# Patient Record
Sex: Female | Born: 1990 | Hispanic: No | Marital: Married | Smoking: Never smoker
Health system: Southern US, Community
[De-identification: ages and names within clinical notes are randomized; demographics above are authoritative.]

## PROBLEM LIST (undated history)

## (undated) DIAGNOSIS — O24419 Gestational diabetes mellitus in pregnancy, unspecified control: Secondary | ICD-10-CM

## (undated) DIAGNOSIS — Z8742 Personal history of other diseases of the female genital tract: Secondary | ICD-10-CM

## (undated) DIAGNOSIS — M5431 Sciatica, right side: Secondary | ICD-10-CM

## (undated) DIAGNOSIS — Z789 Other specified health status: Secondary | ICD-10-CM

## (undated) HISTORY — PX: NO PAST SURGERIES: SHX2092

---

## 2017-08-05 LAB — OB RESULTS CONSOLE GC/CHLAMYDIA
Chlamydia: NEGATIVE
Gonorrhea: NEGATIVE

## 2017-08-05 LAB — OB RESULTS CONSOLE RPR: RPR: NONREACTIVE

## 2017-08-05 LAB — OB RESULTS CONSOLE HIV ANTIBODY (ROUTINE TESTING): HIV: NONREACTIVE

## 2017-08-05 LAB — OB RESULTS CONSOLE RUBELLA ANTIBODY, IGM: Rubella: IMMUNE

## 2017-08-05 LAB — OB RESULTS CONSOLE HEPATITIS B SURFACE ANTIGEN: Hepatitis B Surface Ag: NEGATIVE

## 2017-09-02 NOTE — L&D Delivery Note (Signed)
Operative Delivery Note At 7:17 PM a viable and healthy female was delivered via Vaginal, Vacuum Investment banker, operational(Extractor).  Presentation: vertex; Position: Occiput,, Anterior; Station: +3.  Verbal consent: obtained from patient.  Risks and benefits discussed in detail.  Risks include, but are not limited to the risks of anesthesia, bleeding, infection, damage to maternal tissues, fetal cephalhematoma.  There is also the risk of inability to effect vaginal delivery of the head, or shoulder dystocia that cannot be resolved by established maneuvers, leading to the need for emergency cesarean section. Indication: maternal exhaustion APGAR: 7, 9; weight pending .   Placenta status: spontaneous, intact not sent, .   Cord:  with the following complications: .  Cord pH: none  Anesthesia:epidural   Instruments: mushroom vacuum Episiotomy: Median Lacerations: None Suture Repair: 3.0 chromic Est. Blood Loss (mL):    Mom to postpartum.  Baby to Couplet care / Skin to Skin.  Haifa Hatton A Rollen Selders 02/15/2018, 7:54 PM

## 2018-02-02 LAB — OB RESULTS CONSOLE GBS: STREP GROUP B AG: NEGATIVE

## 2018-02-14 ENCOUNTER — Inpatient Hospital Stay (HOSPITAL_COMMUNITY)
Admission: AD | Admit: 2018-02-14 | Discharge: 2018-02-17 | DRG: 806 | Disposition: A | Discharge status: Home / Self Care | Payer: BLUE CROSS/BLUE SHIELD | Attending: Obstetrics and Gynecology | Admitting: Obstetrics and Gynecology

## 2018-02-14 ENCOUNTER — Encounter (HOSPITAL_COMMUNITY): Payer: Self-pay | Admitting: *Deleted

## 2018-02-14 DIAGNOSIS — O4292 Full-term premature rupture of membranes, unspecified as to length of time between rupture and onset of labor: Secondary | ICD-10-CM | POA: Diagnosis present

## 2018-02-14 DIAGNOSIS — D62 Acute posthemorrhagic anemia: Secondary | ICD-10-CM | POA: Diagnosis not present

## 2018-02-14 DIAGNOSIS — O9081 Anemia of the puerperium: Secondary | ICD-10-CM | POA: Diagnosis not present

## 2018-02-14 DIAGNOSIS — Z8759 Personal history of other complications of pregnancy, childbirth and the puerperium: Secondary | ICD-10-CM

## 2018-02-14 DIAGNOSIS — O4290 Premature rupture of membranes, unspecified as to length of time between rupture and onset of labor, unspecified weeks of gestation: Secondary | ICD-10-CM | POA: Diagnosis present

## 2018-02-14 DIAGNOSIS — Z3A37 37 weeks gestation of pregnancy: Secondary | ICD-10-CM

## 2018-02-14 HISTORY — DX: Other specified health status: Z78.9

## 2018-02-14 LAB — POCT FERN TEST: POCT Fern Test: POSITIVE

## 2018-02-14 LAB — CBC
HEMATOCRIT: 34.5 % — AB (ref 36.0–46.0)
Hemoglobin: 12.3 g/dL (ref 12.0–15.0)
MCH: 31.2 pg (ref 26.0–34.0)
MCHC: 35.7 g/dL (ref 30.0–36.0)
MCV: 87.6 fL (ref 78.0–100.0)
Platelets: 267 10*3/uL (ref 150–400)
RBC: 3.94 MIL/uL (ref 3.87–5.11)
RDW: 13 % (ref 11.5–15.5)
WBC: 11.3 10*3/uL — ABNORMAL HIGH (ref 4.0–10.5)

## 2018-02-14 LAB — TYPE AND SCREEN
ABO/RH(D): B POS
Antibody Screen: NEGATIVE

## 2018-02-14 MED ORDER — OXYCODONE-ACETAMINOPHEN 5-325 MG PO TABS: 1.0000 | ORAL_TABLET | ORAL | Status: DC | PRN

## 2018-02-14 MED ORDER — ACETAMINOPHEN 325 MG PO TABS: 650.0000 mg | ORAL_TABLET | ORAL | Status: DC | PRN

## 2018-02-14 MED ORDER — SOD CITRATE-CITRIC ACID 500-334 MG/5ML PO SOLN: 30.0000 mL | ORAL | Status: DC | PRN

## 2018-02-14 MED ORDER — TERBUTALINE SULFATE 1 MG/ML IJ SOLN: 0.2500 mg | Freq: Once | INTRAMUSCULAR | Status: DC | PRN

## 2018-02-14 MED ORDER — LACTATED RINGERS IV SOLN
INTRAVENOUS | Status: DC
  Administered 2018-02-14 – 2018-02-15 (×2): via INTRAVENOUS

## 2018-02-14 MED ORDER — OXYTOCIN 40 UNITS IN LACTATED RINGERS INFUSION - SIMPLE MED: 2.5000 [IU]/h | INTRAVENOUS | Status: DC

## 2018-02-14 MED ORDER — OXYTOCIN 40 UNITS IN LACTATED RINGERS INFUSION - SIMPLE MED
1.0000 m[IU]/min | INTRAVENOUS | Status: DC
  Administered 2018-02-15: 2 m[IU]/min via INTRAVENOUS
  Filled 2018-02-14: qty 1000

## 2018-02-14 MED ORDER — OXYTOCIN BOLUS FROM INFUSION
500.0000 mL | Freq: Once | INTRAVENOUS | Status: AC
  Administered 2018-02-15: 500 mL via INTRAVENOUS

## 2018-02-14 MED ORDER — ONDANSETRON HCL 4 MG/2ML IJ SOLN: 4.0000 mg | Freq: Four times a day (QID) | INTRAMUSCULAR | Status: DC | PRN

## 2018-02-14 MED ORDER — LIDOCAINE HCL (PF) 1 % IJ SOLN
30.0000 mL | INTRAMUSCULAR | Status: DC | PRN
  Administered 2018-02-15: 30 mL via SUBCUTANEOUS
  Filled 2018-02-14: qty 30

## 2018-02-14 MED ORDER — OXYCODONE-ACETAMINOPHEN 5-325 MG PO TABS: 2.0000 | ORAL_TABLET | ORAL | Status: DC | PRN

## 2018-02-14 MED ORDER — LACTATED RINGERS IV SOLN
500.0000 mL | INTRAVENOUS | Status: DC | PRN
  Administered 2018-02-15: 500 mL via INTRAVENOUS

## 2018-02-14 NOTE — H&P (Signed)
Laurence AlySravya Gumina is a 27 y.o. G1P0 at 7743w5d presenting for ROM. Pt notes  contractions 4/10, q 4 min . Good fetal movement, No vaginal bleeding, started leaking fluid around 1 hr prior to presentation.  [facility] 10 wks - Dated by LMP c/w 10 wk u/s - h/o PCOS, remained on metformin through preg - Femara/ IUI preg   Prenatal Transfer Tool  Maternal Diabetes: No Genetic Screening: Normal Maternal Ultrasounds/Referrals: Normal Fetal Ultrasounds or other Referrals:  None Maternal Substance Abuse:  No Significant Maternal Medications:  None Significant Maternal Lab Results: None     OB History    Gravida  1   Para      Term      Preterm      AB      Living        SAB      TAB      Ectopic      Multiple      Live Births             History reviewed. No pertinent past medical history. Family History: family history is not on file. Social History:  reports that she has never smoked. She has never used smokeless tobacco. She reports that she does not drink alcohol or use drugs.  Review of Systems - Negative except ROM   Dilation: 1 Effacement (%): 50 Station: -3 Exam by:: Camelia Enganielle Simpson RN Blood pressure 130/76, pulse 98, temperature 98.5 F (36.9 C), resp. rate 18, height 4\' 11"  (1.499 m), weight 66.2 kg (146 lb).  Physical Exam:  Vitals:   02/14/18 2207  BP: 130/76  Pulse: 98  Resp: 18  Temp: 98.5 F (36.9 C)  Weight: 66.2 kg (146 lb)  Height: 4\' 11"  (1.499 m)   Toco: q 4min FH: baseline 140s, accelerations present, no deceleratons, 10 beat variability  Prenatal labs: Antibody:  negative Rubella:  immune RPR:   NR HBsAg:   neg HIV:   neg GBS:   neg 1 hr Glucola 134  Genetic screening normal NT, nl AFP Anatomy US normal   Assessment/Plan: 27 y.o. G1P0 at 343w5d ROM, onset of contraction, will allow 4-6 hrs from ROM, if not in active labor will augment with pitocin.  GBS neg Reactive fetal testing.     Lendon ColonelKelly A  Alexx Giambra 02/14/2018, 10:45 PM

## 2018-02-14 NOTE — MAU Note (Signed)
Pt reports white, watery fluid that started leaking 30 mins ago. Pt denies bleeding. Reports contractions started after the leaking and are every 5 mins. Reports good fetal movement. States cervix was closed on last exam.

## 2018-02-14 NOTE — MAU Note (Signed)
Leaking clear fld for last . Some ctxs

## 2018-02-15 ENCOUNTER — Encounter (HOSPITAL_COMMUNITY): Payer: Self-pay

## 2018-02-15 ENCOUNTER — Inpatient Hospital Stay (HOSPITAL_COMMUNITY): Payer: BLUE CROSS/BLUE SHIELD | Admitting: Anesthesiology

## 2018-02-15 ENCOUNTER — Other Ambulatory Visit: Payer: Self-pay

## 2018-02-15 LAB — ABO/RH: ABO/RH(D): B POS

## 2018-02-15 LAB — RPR: RPR Ser Ql: NONREACTIVE

## 2018-02-15 MED ORDER — FENTANYL 2.5 MCG/ML BUPIVACAINE 1/10 % EPIDURAL INFUSION (WH - ANES)
INTRAMUSCULAR | Status: AC
  Filled 2018-02-15: qty 100

## 2018-02-15 MED ORDER — SIMETHICONE 80 MG PO CHEW: 80.0000 mg | CHEWABLE_TABLET | ORAL | Status: DC | PRN

## 2018-02-15 MED ORDER — FENTANYL 2.5 MCG/ML BUPIVACAINE 1/10 % EPIDURAL INFUSION (WH - ANES)
14.0000 mL/h | INTRAMUSCULAR | Status: DC | PRN
  Administered 2018-02-15: 10 mL/h via EPIDURAL

## 2018-02-15 MED ORDER — DIPHENHYDRAMINE HCL 25 MG PO CAPS: 25.0000 mg | ORAL_CAPSULE | Freq: Four times a day (QID) | ORAL | Status: DC | PRN

## 2018-02-15 MED ORDER — IBUPROFEN 600 MG PO TABS
600.0000 mg | ORAL_TABLET | Freq: Four times a day (QID) | ORAL | Status: DC
  Administered 2018-02-15 – 2018-02-17 (×7): 600 mg via ORAL
  Filled 2018-02-15 (×7): qty 1

## 2018-02-15 MED ORDER — PHENYLEPHRINE 40 MCG/ML (10ML) SYRINGE FOR IV PUSH (FOR BLOOD PRESSURE SUPPORT): 80.0000 ug | PREFILLED_SYRINGE | INTRAVENOUS | Status: DC | PRN

## 2018-02-15 MED ORDER — SENNOSIDES-DOCUSATE SODIUM 8.6-50 MG PO TABS
2.0000 | ORAL_TABLET | ORAL | Status: DC
  Administered 2018-02-15 – 2018-02-16 (×2): 2 via ORAL
  Filled 2018-02-15 (×2): qty 2

## 2018-02-15 MED ORDER — EPHEDRINE 5 MG/ML INJ: 10.0000 mg | INTRAVENOUS | Status: DC | PRN

## 2018-02-15 MED ORDER — ONDANSETRON HCL 4 MG PO TABS: 4.0000 mg | ORAL_TABLET | ORAL | Status: DC | PRN

## 2018-02-15 MED ORDER — OXYCODONE HCL 5 MG PO TABS
5.0000 mg | ORAL_TABLET | ORAL | Status: DC | PRN
  Administered 2018-02-16: 5 mg via ORAL
  Filled 2018-02-15: qty 1

## 2018-02-15 MED ORDER — PRENATAL MULTIVITAMIN CH
1.0000 | ORAL_TABLET | Freq: Every day | ORAL | Status: DC
  Administered 2018-02-16 – 2018-02-17 (×2): 1 via ORAL
  Filled 2018-02-15 (×2): qty 1

## 2018-02-15 MED ORDER — DIPHENHYDRAMINE HCL 50 MG/ML IJ SOLN: 12.5000 mg | INTRAMUSCULAR | Status: DC | PRN

## 2018-02-15 MED ORDER — ONDANSETRON HCL 4 MG/2ML IJ SOLN: 4.0000 mg | INTRAMUSCULAR | Status: DC | PRN

## 2018-02-15 MED ORDER — LACTATED RINGERS IV SOLN: 500.0000 mL | Freq: Once | INTRAVENOUS | Status: DC

## 2018-02-15 MED ORDER — WITCH HAZEL-GLYCERIN EX PADS: 1.0000 "application " | MEDICATED_PAD | CUTANEOUS | Status: DC | PRN

## 2018-02-15 MED ORDER — PHENYLEPHRINE 40 MCG/ML (10ML) SYRINGE FOR IV PUSH (FOR BLOOD PRESSURE SUPPORT)
PREFILLED_SYRINGE | INTRAVENOUS | Status: AC
  Administered 2018-02-15: 80 ug via INTRAVENOUS
  Filled 2018-02-15: qty 10

## 2018-02-15 MED ORDER — LIDOCAINE HCL (PF) 1 % IJ SOLN
INTRAMUSCULAR | Status: DC | PRN
  Administered 2018-02-15: 5 mL
  Administered 2018-02-15: 5 mL via EPIDURAL
  Administered 2018-02-15: 3 mL
  Administered 2018-02-15: 3 mL via EPIDURAL

## 2018-02-15 MED ORDER — LACTATED RINGERS IV SOLN
500.0000 mL | Freq: Once | INTRAVENOUS | Status: AC
  Administered 2018-02-15: 500 mL via INTRAVENOUS

## 2018-02-15 MED ORDER — OXYCODONE HCL 5 MG PO TABS: 10.0000 mg | ORAL_TABLET | ORAL | Status: DC | PRN

## 2018-02-15 MED ORDER — BUTORPHANOL TARTRATE 1 MG/ML IJ SOLN
1.0000 mg | INTRAMUSCULAR | Status: DC | PRN
Start: 2018-02-15 — End: 2018-02-15
  Administered 2018-02-15: 1 mg via INTRAVENOUS
  Filled 2018-02-15: qty 1

## 2018-02-15 MED ORDER — DIBUCAINE 1 % RE OINT
1.0000 "application " | TOPICAL_OINTMENT | RECTAL | Status: DC | PRN
  Filled 2018-02-15: qty 28

## 2018-02-15 MED ORDER — PHENYLEPHRINE 40 MCG/ML (10ML) SYRINGE FOR IV PUSH (FOR BLOOD PRESSURE SUPPORT)
80.0000 ug | PREFILLED_SYRINGE | INTRAVENOUS | Status: DC | PRN
  Administered 2018-02-15: 80 ug via INTRAVENOUS

## 2018-02-15 MED ORDER — ZOLPIDEM TARTRATE 5 MG PO TABS
5.0000 mg | ORAL_TABLET | Freq: Every evening | ORAL | Status: DC | PRN
Start: 2018-02-15 — End: 2018-02-17

## 2018-02-15 MED ORDER — COCONUT OIL OIL
1.0000 "application " | TOPICAL_OIL | Status: DC | PRN
  Filled 2018-02-15: qty 120

## 2018-02-15 MED ORDER — BENZOCAINE-MENTHOL 20-0.5 % EX AERO
1.0000 "application " | INHALATION_SPRAY | CUTANEOUS | Status: DC | PRN
  Filled 2018-02-15: qty 56

## 2018-02-15 MED ORDER — HYDROMORPHONE HCL 1 MG/ML IJ SOLN
1.0000 mg | INTRAMUSCULAR | Status: DC | PRN
  Administered 2018-02-15: 1 mg via INTRAVENOUS
  Filled 2018-02-15 (×2): qty 1

## 2018-02-15 MED ORDER — ACETAMINOPHEN 325 MG PO TABS
650.0000 mg | ORAL_TABLET | ORAL | Status: DC | PRN
Start: 2018-02-15 — End: 2018-02-17

## 2018-02-15 MED ORDER — SODIUM BICARBONATE 8.4 % IV SOLN
INTRAVENOUS | Status: DC | PRN
  Administered 2018-02-15: 5 mL via EPIDURAL

## 2018-02-15 MED ORDER — FERROUS SULFATE 325 (65 FE) MG PO TABS
325.0000 mg | ORAL_TABLET | Freq: Two times a day (BID) | ORAL | Status: DC
  Administered 2018-02-16 – 2018-02-17 (×3): 325 mg via ORAL
  Filled 2018-02-15 (×4): qty 1

## 2018-02-15 NOTE — Anesthesia Pain Management Evaluation Note (Signed)
  CRNA Pain Management Visit Note  Patient: Martha Kelley, 27 y.o., female  "Hello I am a member of the anesthesia team at [facility]. We have an anesthesia team available at all times to provide care throughout the hospital, including epidural management and anesthesia for C-section. I don't know your plan for the delivery whether it a natural birth, water birth, IV sedation, nitrous supplementation, doula or epidural, but we want to meet your pain goals."   1.Was your pain managed to your expectations on prior hospitalizations?   No prior hospitalizations  2.What is your expectation for pain management during this hospitalization?     Epidural  3.How can we help you reach that goal? Epidural  Record the patient's initial score and the patient's pain goal.   Pain: 4  Pain Goal: 7 [facility] wants you to be able to say your pain was always managed very well.  Rica RecordsICKELTON,Miosotis Wetsel 02/15/2018

## 2018-02-15 NOTE — Progress Notes (Signed)
S; tired  O;BP 110/74   Pulse (!) 113   Temp 99.4 F (37.4 C) (Oral)   Resp 16   Ht 4\' 11"  (1.499 m)   Wt 66.2 kg (146 lb)   SpO2 99%   BMI 29.49 kg/m  VE fully (+) 2 caput  Tracing: baseline 130 small accel Ctx q 2-3 mins  IMP: complete P) cont pushing

## 2018-02-15 NOTE — Anesthesia Procedure Notes (Signed)
Epidural Patient location during procedure: OB Start time: 02/15/2018 1:30 PM End time: 02/15/2018 1:46 PM  Staffing Anesthesiologist: Jairo BenJackson, Mio Schellinger, MD Performed: anesthesiologist   Preanesthetic Checklist Completed: patient identified, surgical consent, pre-op evaluation, timeout performed, IV checked, risks and benefits discussed and monitors and equipment checked  Epidural Patient position: sitting Prep: site prepped and draped and DuraPrep Patient monitoring: blood pressure, continuous pulse ox and heart rate Approach: midline Location: L3-L4 Injection technique: LOR air  Needle:  Needle type: Tuohy  Needle gauge: 17 G Needle length: 9 cm Needle insertion depth: 5 cm Catheter type: closed end flexible Catheter size: 19 Gauge Catheter at skin depth: 12 cm Test dose: negative (1% lidocaine)  Assessment Events: blood not aspirated, injection not painful, no injection resistance, negative IV test and no paresthesia  Additional Notes Pt identified in Labor room.  Monitors applied. Working IV access confirmed. Sterile prep, drape lumbar spine.  1% lido local L 3,4.  #17ga Touhy LOR air at 5 cm L 3,4, cath in easily to 10 cm skin. Test dose OK, cath dosed and infusion begun.  Patient asymptomatic, VSS, no heme aspirated, tolerated well.  Sandford Craze Emry Barbato, MDReason for block:procedure for pain

## 2018-02-15 NOTE — Progress Notes (Signed)
S: Doing well, no complaints, pain well controlled with epidural, pt feeling low pressure  O: BP (!) 133/92   Pulse 97   Temp 99.5 F (37.5 C) (Oral)   Resp 16   Ht 4\' 11"  (1.499 m)   Wt 66.2 kg (146 lb)   BMI 29.49 kg/m    FHT:  FHR: 120s bpm, variability: moderate,  accelerations:  Present,  decelerations:  Absent UC:   regular, every 2-3 minutes SVE:   Dilation: 10 Effacement (%): 100 Station: Plus 2 Exam by:: Iran OuchStephanie Russell, RN   Repeat exam by me, difficult to palpate ischial spine, +1 station though vtx visible at introitus with pushing.   A / P:  27 y.o.  OB History  Gravida Para Term Preterm AB Living  2 0 0 0 1 0  SAB TAB Ectopic Multiple Live Births  1 0 0 0 0   at 3924w6d IOL, PROM, working towards vaginal delivery. good progress in active labor. start pushing now. Will transfer care to Dr. Cherly Hensenousins at 5pm  Fetal Wellbeing:  Category I Pain Control:  Epidural  Anticipated MOD:  working towards SVD  Hartford FinancialKelly A Tinie Mcgloin 02/15/2018, 4:43 PM

## 2018-02-15 NOTE — Anesthesia Preprocedure Evaluation (Addendum)
Anesthesia Evaluation  Patient identified by MRN, date of birth, ID band Patient awake    Reviewed: Allergy & Precautions, NPO status , Patient's Chart, lab work & pertinent test results  History of Anesthesia Complications Negative for: history of anesthetic complications  Airway Mallampati: IV  TM Distance: >3 FB Neck ROM: Full    Dental  (+) Dental Advisory Given   Pulmonary neg pulmonary ROS,    breath sounds clear to auscultation       Cardiovascular negative cardio ROS   Rhythm:Regular Rate:Normal     Neuro/Psych negative neurological ROS     GI/Hepatic negative GI ROS, Neg liver ROS,   Endo/Other  negative endocrine ROS  Renal/GU negative Renal ROS     Musculoskeletal   Abdominal   Peds  Hematology plt 267k   Anesthesia Other Findings   Reproductive/Obstetrics (+) Pregnancy                            Anesthesia Physical Anesthesia Plan  ASA: II  Anesthesia Plan: Epidural   Post-op Pain Management:    Induction:   PONV Risk Score and Plan: 2 and Treatment may vary due to age or medical condition  Airway Management Planned: Natural Airway  Additional Equipment:   Intra-op Plan:   Post-operative Plan:   Informed Consent: I have reviewed the patients History and Physical, chart, labs and discussed the procedure including the risks, benefits and alternatives for the proposed anesthesia with the patient or authorized representative who has indicated his/her understanding and acceptance.   Dental advisory given  Plan Discussed with:   Anesthesia Plan Comments: (Patient identified. Risks/Benefits/Options discussed with patient including but not limited to bleeding, infection, nerve damage, paralysis, failed block, incomplete pain control, headache, blood pressure changes, nausea, vomiting, reactions to medication both or allergic, itching and postpartum back pain.  Confirmed with bedside nurse the patient's most recent platelet count. Confirmed with patient that they are not currently taking any anticoagulation, have any bleeding history or any family history of bleeding disorders. Patient expressed understanding and wished to proceed. All questions were answered. )       Anesthesia Quick Evaluation

## 2018-02-15 NOTE — Progress Notes (Signed)
S: Doing well, no complaints, pain worsening, just getting epidural now.   O: BP 124/80   Pulse 87   Temp 98.5 F (36.9 C) (Oral)   Resp 16   Ht 4\' 11"  (1.499 m)   Wt 66.2 kg (146 lb)   BMI 29.49 kg/m    FHT:  FHR: 120s bpm, variability: moderate,  accelerations:  Present,  decelerations:  Absent UC:   regular, every 2-3 minutes, pitocin SVE:   Dilation: 4 Effacement (%): 90 Station: -2 Exam by:: Dr. Ernestina PennaFogleman   Cervical foley balloon removed from vagina. AROM forebag  A / P:  27 y.o.  OB History  Gravida Para Term Preterm AB Living  2 0 0 0 1 0  SAB TAB Ectopic Multiple Live Births  1 0 0 0 0   at 293w6d IOL after SROM, on pitocin, s/p foley, getting into active labor.   Fetal Wellbeing:  Category I Pain Control:  Epidural  Anticipated MOD:  NSVD  Martha Kelley 02/15/2018, 11:23 AM

## 2018-02-15 NOTE — Anesthesia Procedure Notes (Signed)
Epidural Patient location during procedure: OB Start time: 02/15/2018 10:45 AM End time: 02/15/2018 11:14 AM  Staffing Anesthesiologist: Jairo BenJackson, Suni Jarnagin, MD Performed: anesthesiologist   Preanesthetic Checklist Completed: patient identified, surgical consent, pre-op evaluation, timeout performed, IV checked, risks and benefits discussed and monitors and equipment checked  Epidural Patient position: sitting Prep: site prepped and draped and DuraPrep Patient monitoring: blood pressure, continuous pulse ox and heart rate Approach: midline Location: L3-L4 Injection technique: LOR air  Needle:  Needle type: Tuohy  Needle gauge: 17 G Needle length: 9 cm Needle insertion depth: 6 cm Catheter type: closed end flexible Catheter size: 19 Gauge Catheter at skin depth: 11 cm Test dose: negative (1% lidocaine)  Assessment Events: blood not aspirated, injection not painful, no injection resistance, negative IV test and no paresthesia  Additional Notes Pt identified in Labor room.  Monitors applied. Working IV access confirmed. Sterile prep, drape lumbar spine.  1% lido local L 2,3.  #17ga Touhy os, repeat local L 3,4 and #17 ga Touhy LOR air at 6 cm, cath difficult to thread, dose through Touhy and cath in easily to 11 cm skin. Test dose OK, cath dosed and infusion begun.  Patient asymptomatic, VSS, no heme aspirated, tolerated well.  Martha Kelley, MDReason for block:procedure for pain

## 2018-02-15 NOTE — Progress Notes (Signed)
S: Doing well, no complaints, pain well controlled, feeling 'little pain' that 'comes and goes'. Single dose IV pain med at 2am. Planning epidural  O: BP 116/76   Pulse 77   Temp 98.5 F (36.9 C) (Oral)   Resp 16   Ht 4\' 11"  (1.499 m)   Wt 66.2 kg (146 lb)   BMI 29.49 kg/m    FHT:  FHR: 120s bpm, variability: moderate,  accelerations:  Present,  decelerations:  Absent UC:   regular, every 34 minutes, pit at 14 muits/ min SVE:   Dilation: 2 Effacement (%): 90 Station: -2 Exam by:: Dr Ernestina PennaFogleman   Foley balloon placed: exam done, consent obtained, foley catheter threaded through cervix, filled with 60cc saline, placed on traction. Pt tolerated well.    A / P:  27 y.o.  OB History  Gravida Para Term Preterm AB Living  1 0 0 0 0 0  SAB TAB Ectopic Multiple Live Births  0 0 0 0 0   at 3246w6d SROM, now on pitocin and still in latent labor, cervical foley now.  Fetal Wellbeing:  Category I Pain Control:  IV pain meds  Anticipated MOD:  NSVD  Lendon ColonelKelly A Icesis Renn 02/15/2018, 8:28 AM

## 2018-02-16 DIAGNOSIS — Z8759 Personal history of other complications of pregnancy, childbirth and the puerperium: Secondary | ICD-10-CM

## 2018-02-16 LAB — CBC
HEMATOCRIT: 25.6 % — AB (ref 36.0–46.0)
Hemoglobin: 9.2 g/dL — ABNORMAL LOW (ref 12.0–15.0)
MCH: 31.4 pg (ref 26.0–34.0)
MCHC: 35.9 g/dL (ref 30.0–36.0)
MCV: 87.4 fL (ref 78.0–100.0)
Platelets: 197 10*3/uL (ref 150–400)
RBC: 2.93 MIL/uL — ABNORMAL LOW (ref 3.87–5.11)
RDW: 13.2 % (ref 11.5–15.5)
WBC: 14.8 10*3/uL — ABNORMAL HIGH (ref 4.0–10.5)

## 2018-02-16 MED ORDER — MAGNESIUM OXIDE 400 (241.3 MG) MG PO TABS
400.0000 mg | ORAL_TABLET | Freq: Every day | ORAL | Status: DC
  Administered 2018-02-16 – 2018-02-17 (×2): 400 mg via ORAL
  Filled 2018-02-16 (×3): qty 1

## 2018-02-16 NOTE — Progress Notes (Signed)
PPD #1, VAVD, median episiotomy repair, baby boy   S:  Reports feeling tired, but okay             Tolerating po/ No nausea or vomiting / Denies dizziness or SOB             Bleeding is moderate, denies clots              Pain controlled with Motrin             Up ad lib / ambulatory / voiding QS  Newborn breast feeding with formula supplementation - states breastfeeding is very difficult; has tried latching and pumping; then formula fed; open to continuing to try breastfeeding  / Circumcision - declined  O:               VS: BP 103/77 (BP Location: Right Arm)   Pulse 90   Temp 98.3 F (36.8 C) (Oral)   Resp 18   Ht 4\' 11"  (1.499 m)   Wt 66.2 kg (146 lb)   SpO2 100%   Breastfeeding? Unknown   BMI 29.49 kg/m    LABS:              Recent Labs    02/14/18 2240 02/16/18 0500  WBC 11.3* 14.8*  HGB 12.3 9.2*  PLT 267 197               Blood type: --/--/B POS, B POS Performed at [facility], [address] [place], [place] [address]  [phone](06/15 2240)  Rubella: Immune (12/04 0000)                     I&O: Intake/Output      06/16 0701 - 06/17 0700 06/17 0701 - 06/18 0700   Urine (mL/kg/hr) 1325 (0.8)    Blood 350    Total Output 1675    Net -1675         Urine Occurrence 1 x                  Physical Exam:             Alert and oriented X3  Lungs: Clear and unlabored  Heart: regular rate and rhythm / no murmurs  Abdomen: soft, non-tender, non-distended              Fundus: firm, non-tender, U-1  Perineum: well approximated median episiotomy repair - mild edema noted; ice pack in place; no significant erythema, ecchymosis or evidence of hematoma   Lochia: moderate, no clots   Extremities: no edema, no calf pain or tenderness, TED hose in place    A: PPD # 1, VAVD  Median episiotomy   ABL Anemia - on oral FE BID  Doing well - stable status  P: Routine post partum orders  Magnesium oxide 400mg  daily   See lactation today   May shower today   Encouraged to  rest when baby rests  Anticipate discharge home tomorrow   Carlean JewsMeredith Millard Bautch, MSN, CNM [facility]

## 2018-02-16 NOTE — Anesthesia Postprocedure Evaluation (Signed)
Anesthesia Post Note  Patient: Martha Kelley  Procedure(s) Performed: AN AD HOC LABOR EPIDURAL     Patient location during evaluation: Mother Baby Anesthesia Type: Epidural Level of consciousness: awake Pain management: satisfactory to patient Vital Signs Assessment: post-procedure vital signs reviewed and stable Respiratory status: spontaneous breathing Cardiovascular status: stable Anesthetic complications: no    Last Vitals:  Vitals:   02/16/18 0245 02/16/18 0638  BP: 110/73 103/77  Pulse: 89 90  Resp: 18 18  Temp: 36.7 C 36.8 C  SpO2: 100% 100%    Last Pain:  Vitals:   02/16/18 0638  TempSrc: Oral  PainSc: 0-No pain   Pain Goal:                 Cephus ShellingBURGER,Emory Gallentine

## 2018-02-16 NOTE — Lactation Note (Signed)
This note was copied from a baby's chart. Lactation Consultation Note Baby 8 hrs old. 37 wks. 6.1lbs parents understand baby will drop below 6 lbs in weight. Stressed importance of I&O. Gagging occasionally. FOB gave formula. Early term infant information reviewed w/supplementing amount.  Parents refused need of interpreter. Encouraged at any time didn't understand anything to ask for interpreter. Parents agreed and stated they understand English well.  Mom has heavy round breast w/bulbous areola semi flat nipple. Everts slightly w/stimulation. Encouraged hand pump prior to latching and roll nipple in finger tips to evert.  Shells given to wear in Kelley. Mom has nursing bra. LC fears baby may not obtain deep enough latch if breast filling and not compressible.  LC hand expressed 3ml colostrum. Milk storage discussed.  Mom shown how to use DEBP & how to disassemble, clean, & reassemble parts. Mom knows to pump q3h for 15-20 min. Encouraged to give any colostrum to baby as supplement d/t weight will be under 6 lbs.  Newborn behavior, STS, I&O, cluster feeding, supply and demand discussed.  Mom encouraged to feed baby 8-12 times/24 hours and with feeding cues.  Encouraged to call for assistance or questions.  WH/LC brochure given w/resources, support groups and LC services.  Patient Name: Martha Kelley ZOXWR'UToday's Date: Kelley Reason for consult: Initial assessment;Early term 37-38.6wks;Maternal endocrine disorder Type of Endocrine Disorder?: Diabetes(PCOS)   Maternal Data    Feeding Feeding Type: Formula Nipple Type: Slow - flow  LATCH Score       Type of Nipple: Flat(semi flat)  Comfort (Breast/Nipple): Soft / non-tender        Interventions Interventions: Breast feeding basics reviewed;Support pillows;Expressed milk;Breast massage;Hand express;Shells;Reverse pressure;Hand pump;Pre-pump if needed;Breast compression;DEBP  Lactation Tools Discussed/Used Tools:  Shells;Pump Shell Type: Inverted Breast pump type: Double-Electric Breast Pump;Manual Pump Review: Setup, frequency, and cleaning;Milk Storage Initiated by:: Peri JeffersonL. Quintyn Dombek RN IBCLC Date initiated:: 02/16/18   Consult Status Consult Status: Follow-up Date: 02/16/18 Follow-up type: In-patient    Martha Kelley, Martha NickelLAURA G Kelley, Martha Kelley

## 2018-02-17 MED ORDER — IBUPROFEN 600 MG PO TABS: 600.0000 mg | ORAL_TABLET | Freq: Four times a day (QID) | ORAL | 0 refills | Status: DC

## 2018-02-17 MED ORDER — FERROUS SULFATE 325 (65 FE) MG PO TABS: 325.0000 mg | ORAL_TABLET | Freq: Every day | ORAL | 3 refills | Status: DC

## 2018-02-17 NOTE — Lactation Note (Addendum)
This note was copied from a baby's chart. Lactation Consultation Note  Patient Name: Boy Laurence AlySravya Maietta XBJYN'WToday's Date: 02/17/2018   Mom was observed pumping. She is comfortable with size 24 flanges. She's not getting much with pumping, but her breasts are heavier. Hand expression was taught to Mom. Mom is still learning how to do hand expression herself, but lots of colostrum was yielded when I was showing her how to do it. We did hand expression before and after pumping. Dad will finger-feed the EBM (a few mLs) at the next feeding (he has a curved-tip syringe and knows how to do it).    Mom would like to feed infant at breast. I told her we would address this once the bili levels have decreased (it is likely she will need a nipple shield to get infant to latch). Meanwhile, I encouraged her to pump/hand express every time infant receives formula. She verbalized understanding.  Mom has an Ameda pump at home. Mom w/hx of PCOS.   Lurline HareRichey, Kasiya Burck [facility] 02/17/2018, 2:40 PM

## 2018-02-17 NOTE — Discharge Summary (Signed)
Obstetric Discharge Summary   Patient Name: Martha Kelley DOB: 1991-01-22 MRN: [id number]  Date of Admission: 02/14/2018 Date of Discharge: 02/17/2018 Date of Delivery: 02/15/18 Gestational Age at Delivery: [redacted]w[redacted]d  Primary OB: [facility] - Dr. Juliene Pina  Antepartum complications:  Dated by LMP c/w 10 wk u/s - h/o PCOS, remained on metformin through preg - Femara/ IUI preg Prenatal Labs:  ABO/RH: B Pos Antibody:  negative Rubella:  immune RPR:   NR HBsAg:   neg HIV:   neg GBS:   neg 1 hr Glucola 134          Genetic screening normal NT, nl AFP Anatomy US normal  Admitting Diagnosis: SROM at 37+6 weeks   Secondary Diagnoses: Patient Active Problem List   Diagnosis Date Noted  . Status post vacuum-assisted vaginal delivery: maternal exhaustion 02/16/2018  . Obstetrical laceration: median episiotomy  02/16/2018  . Postpartum care following VAVD (6/16) 02/15/2018  . PROM (premature rupture of membranes) 02/14/2018    Augmentation: Pitocin  Complications: None  Date of Delivery: 01/15/18 Delivered By: Dr. Cherly Hensen Delivery Type: VAVD Anesthesia: epidural Placenta: sponatneous Laceration: none Episiotomy: Median  Newborn Data: Live born female  Birth Weight: 6 lb 1.5 oz (2765 g) APGAR: 7, 9  Newborn Delivery   Birth date/time:  02/15/2018 19:17:00 Delivery type:  Vaginal, Vacuum (Extractor)     Hospital/Postpartum Course  (Vaginal Delivery): Pt. Admitted in labor with SROM, progressed well with SROM and Pitocin.  She was delivered by VAVD due to maternal exhaustion - see notes for details.  Patient had an uncomplicated postpartum course.  By time of discharge on PPD#2, her pain was controlled on oral pain medications; she had appropriate lochia and was ambulating, voiding without difficulty and tolerating regular diet.  She was deemed stable for discharge to home.    Labs: CBC Latest Ref Rng & Units 02/16/2018 02/14/2018  WBC 4.0 - 10.5 K/uL 14.8(H) 11.3(H)   Hemoglobin 12.0 - 15.0 g/dL 4.0(J) 81.1  Hematocrit 36.0 - 46.0 % 25.6(L) 34.5(L)  Platelets 150 - 400 K/uL 197 267   Conflict (See Lab Report): B POS/B POS Performed at [facility], [address], [place], [place] [address]   Physical exam:  BP 117/76 (BP Location: Right Arm)   Pulse 82   Temp 98.9 F (37.2 C) (Oral)   Resp 18   Ht 4\' 11"  (1.499 m)   Wt 66.2 kg (146 lb)   SpO2 98%   Breastfeeding? Unknown   BMI 29.49 kg/m  General: alert and no distress Pulm: normal respiratory effort Lochia: appropriate Abdomen: soft, NT Uterine Fundus: firm, below umbilicus Perineum: healing well, no significant erythema, no significant edema Extremities: No evidence of DVT seen on physical exam. Trace pedal edema.  Disposition: stable, discharge to home Baby Feeding: breast milk and formula Baby Disposition: will stay as a baby patient on phototherapy  Contraception: unsure; pt. With hx of infertility and used Femara for conception  Rh Immune globulin given: N/A Rubella vaccine given: N/A Tdap vaccine given in AP or PP setting: UTD Flu vaccine given in AP or PP setting: not on file    Plan:  Shaterra Schulke was discharged to home in good condition. [facility] appointment at [facility] in 6 weeks.  Discharge Instructions: Per After Visit Summary. Refer to After Visit Summary and [facility] discharge booklet  Activity: Advance as tolerated. Pelvic rest for 6 weeks.   Diet: Regular, Heart Healthy Discharge Medications: Allergies as of 02/17/2018   No Known Allergies  Medication List    TAKE these medications   ferrous sulfate 325 (65 FE) MG tablet Take 1 tablet (325 mg total) by mouth daily with breakfast. What changed:  when to take this   ibuprofen 600 MG tablet Commonly known as:  ADVIL,MOTRIN Take 1 tablet (600 mg total) by mouth every 6 (six) hours.   prenatal multivitamin Tabs tablet Take 1 tablet by mouth daily at 12 noon.             Discharge Care Instructions  (From admission, onward)        Start     Ordered   02/17/18 0000  Discharge wound care:    Comments:  Warm sitz baths 3-5 times daily   02/17/18 1056     Outpatient follow up:  Follow-up Information    Shea EvansMody, Vaishali, MD. Schedule an appointment as soon as possible for a visit in 6 week(s).   Specialty:  Obstetrics and Gynecology Why:  Postpartum visit  Contact information: [address] [place] [address] [phone]           Signed:  Carlean JewsMeredith Kathalina Ostermann, MSN, CNM [facility]

## 2018-02-17 NOTE — Lactation Note (Signed)
This note was copied from a baby's chart. Lactation Consultation Note  Patient Name: Boy Laurence AlySravya Jabs ZOXWR'UToday's Date: 02/17/2018   Infant has only been bottle fed since yesterday morning. Mom plans to pump again at 1430; Mom agreeable to me returning at that time.  Mom denies breast changes during pregnancy (except for darkening). She does, however, feel that her breasts have enlarged since birth.  Parents have my # to call if they need me to return before 1430.  Infant was observed bottle feeding w/the yellow slow-flow nipple w/comfortable swallows.    Lurline HareRichey, Caralynn Gelber [facility] 02/17/2018, 1:20 PM

## 2018-02-17 NOTE — Progress Notes (Signed)
PPD #2, VAVD, median episiotomy repair, baby boy    S:  Reports feeling well, but anxious about baby's bilirubin, phototherapy, and feedings             Tolerating po/ No nausea or vomiting / Denies dizziness or SOB             Bleeding is moderate             Pain controlled with Motrin             Up ad lib / ambulatory / voiding QS  Newborn breast feeding with formula supplementation - baby will not be discharged today due to hyperbilirubinemia - going on single phototherapy / Circumcision - declined   O:               VS: BP 117/76 (BP Location: Right Arm)   Pulse 82   Temp 98.9 F (37.2 C) (Oral)   Resp 18   Ht 4\' 11"  (1.499 m)   Wt 66.2 kg (146 lb)   SpO2 98%   Breastfeeding? Unknown   BMI 29.49 kg/m    LABS:              Recent Labs    02/14/18 2240 02/16/18 0500  WBC 11.3* 14.8*  HGB 12.3 9.2*  PLT 267 197               Blood type: --/--/B POS, B POS Performed at [facility], [address] [place], [place] [id number]  (06/15 2240)  Rubella: Immune (12/04 0000)                                 Physical Exam:             Alert and oriented X3  Lungs: Clear and unlabored  Heart: regular rate and rhythm / no murmurs  Abdomen: soft, non-tender, non-distended              Fundus: firm, non-tender, U-1  Perineum: well approximated median episiotomy, no significant edema, erythema, or ecchymosis  Lochia: small, no clots   Extremities: trace pedal edema, no calf pain or tenderness, TED hose in place     A: PPD # 2, VAVD             Median episiotomy              ABL Anemia - on oral FE BID             Doing well - stable status   P: Routine post partum orders  Discharge home today, but will plan to room-in with baby while on phototherapy  WOB discharge book, instructions, and warning s/s reviewed   See lactation today   Pt. Desires to take home OTC iron supplements - advised to continue x 6 weeks   F/u with Dr. Juliene PinaMody or Dr. Cherly Hensenousins in [facility]   Carlean JewsMeredith Daryl Beehler, MSN, CNM [facility]

## 2018-02-18 ENCOUNTER — Ambulatory Visit: Payer: Self-pay

## 2018-02-18 NOTE — Lactation Note (Signed)
This note was copied from a baby's chart. Lactation Consultation Note  Patient Name: Martha Kelley Nurse NWGNF'AToday's Date: 02/18/2018 Reason for consult: Follow-up assessment  Mom recently pumped 28 mL. Mom knows she can now give EBM instead of formula (via bottle).   Mom has only pumped once today. I asked her why (as she had understood yesterday to pump q3h or whenever infant receives formula). She said that she had been alone with the child and she wanted to be available to hold the child, if needed. Mom assured me that [facility] she will pump q3h.   I asked Mom if she were interested in putting infant to breast. She answered that she had planned on doing that once she was at home. Parents had already ordered her a size 24 nipple shield to try at home. When I explained to the parents that a nipple shield should not be tried on their own, but under the supervision of a skilled lactation provider, parents said that they would be willing to attempt feeding at the breast while in-patient. (I informed parents that the size 24 nipple shield would be too large for mom's nipple diameter). Parents have my # to call for feeding attempts.   Lurline HareRichey, [facility] 02/18/2018, 1:01 PM

## 2018-02-19 ENCOUNTER — Ambulatory Visit: Payer: Self-pay

## 2018-02-19 NOTE — Lactation Note (Signed)
This note was copied from a baby's chart. Lactation Consultation Note: Mom had just fed baby 55 ml of EBM. He is asleep in bassinet at present. Mom reports breasts are feeling much fuller this morning. Reviewed importance of frequent nursing or pumping to prevent engorgement. Treatment of engorgement reviewed with mom. Has Ameda pump for home. No questions at present. Offered assist with latch if baby wakes before DC. Reviewed our phone number and OP appointments as resources for support after DC. Encouraged to make OP appointment for assist. To call prn  Patient Name: Martha Kelley'UToday's Date: 02/19/2018 Reason for consult: Follow-up assessment;Early term 37-38.6wks   Maternal Data Formula Feeding for Exclusion: Yes Reason for exclusion: Mother's choice to formula and breast feed on admission Has patient been taught Hand Expression?: Yes Does the patient have breastfeeding experience prior to this delivery?: No  Feeding Feeding Type: Formula Nipple Type: Slow - flow  LATCH Score                   Interventions    Lactation Tools Discussed/Used     Consult Status Consult Status: Complete    Pamelia HoitWeeks, Josiah Nieto D 02/19/2018, 8:59 AM

## 2020-09-02 NOTE — L&D Delivery Note (Signed)
Delivery Note At 11:52 AM a viable female was delivered via  (Presentation:  OA).  APGAR: 9, 9; weight  pending Placenta status: spontaneous and complete.  Cord:   with the following complications:  none.  Cord pH: NA  Anesthesia:  Epidural Episiotomy:  NOne Lacerations:  None Suture Repair:  NA Est. Blood Loss (mL):  200 cc  Mom to postpartum.  Baby to Couplet care / Skin to Skin.  [name] 02/14/2021, 12:04 PM

## 2020-09-05 LAB — OB RESULTS CONSOLE GC/CHLAMYDIA
Chlamydia: NEGATIVE
Gonorrhea: NEGATIVE

## 2020-09-05 LAB — OB RESULTS CONSOLE RUBELLA ANTIBODY, IGM
Rubella: IMMUNE
Rubella: IMMUNE

## 2020-09-05 LAB — OB RESULTS CONSOLE HIV ANTIBODY (ROUTINE TESTING): HIV: NONREACTIVE

## 2020-09-05 LAB — OB RESULTS CONSOLE HEPATITIS B SURFACE ANTIGEN
Hepatitis B Surface Ag: NEGATIVE
Hepatitis B Surface Ag: NEGATIVE

## 2020-09-05 LAB — OB RESULTS CONSOLE ABO/RH: RH Type: POSITIVE

## 2020-09-05 LAB — OB RESULTS CONSOLE RPR: RPR: NONREACTIVE

## 2020-12-01 ENCOUNTER — Other Ambulatory Visit: Payer: Self-pay | Admitting: Obstetrics & Gynecology

## 2020-12-04 ENCOUNTER — Other Ambulatory Visit: Payer: Self-pay | Admitting: Obstetrics & Gynecology

## 2020-12-04 DIAGNOSIS — O365930 Maternal care for other known or suspected poor fetal growth, third trimester, not applicable or unspecified: Secondary | ICD-10-CM

## 2020-12-05 ENCOUNTER — Encounter: Payer: Self-pay | Admitting: Obstetrics & Gynecology

## 2020-12-05 ENCOUNTER — Ambulatory Visit: Payer: Managed Care, Other (non HMO) | Attending: Obstetrics and Gynecology

## 2020-12-05 ENCOUNTER — Ambulatory Visit: Payer: Managed Care, Other (non HMO) | Admitting: *Deleted

## 2020-12-05 ENCOUNTER — Other Ambulatory Visit: Payer: Self-pay

## 2020-12-05 ENCOUNTER — Encounter: Payer: Self-pay | Admitting: *Deleted

## 2020-12-05 ENCOUNTER — Other Ambulatory Visit: Payer: Self-pay | Admitting: *Deleted

## 2020-12-05 ENCOUNTER — Other Ambulatory Visit: Payer: Self-pay | Admitting: Obstetrics & Gynecology

## 2020-12-05 VITALS — BP 104/71 | HR 104

## 2020-12-05 DIAGNOSIS — O365930 Maternal care for other known or suspected poor fetal growth, third trimester, not applicable or unspecified: Secondary | ICD-10-CM | POA: Diagnosis not present

## 2020-12-05 DIAGNOSIS — O365990 Maternal care for other known or suspected poor fetal growth, unspecified trimester, not applicable or unspecified: Secondary | ICD-10-CM

## 2020-12-05 DIAGNOSIS — Z3A28 28 weeks gestation of pregnancy: Secondary | ICD-10-CM

## 2020-12-19 ENCOUNTER — Ambulatory Visit: Payer: Managed Care, Other (non HMO) | Attending: Maternal & Fetal Medicine

## 2020-12-19 ENCOUNTER — Other Ambulatory Visit: Payer: Self-pay

## 2020-12-19 ENCOUNTER — Ambulatory Visit: Payer: Managed Care, Other (non HMO) | Admitting: *Deleted

## 2020-12-19 ENCOUNTER — Encounter: Payer: Self-pay | Admitting: *Deleted

## 2020-12-19 ENCOUNTER — Ambulatory Visit (HOSPITAL_BASED_OUTPATIENT_CLINIC_OR_DEPARTMENT_OTHER): Payer: Managed Care, Other (non HMO) | Admitting: *Deleted

## 2020-12-19 VITALS — BP 114/71 | HR 90

## 2020-12-19 DIAGNOSIS — Z362 Encounter for other antenatal screening follow-up: Secondary | ICD-10-CM

## 2020-12-19 DIAGNOSIS — O365931 Maternal care for other known or suspected poor fetal growth, third trimester, fetus 1: Secondary | ICD-10-CM | POA: Insufficient documentation

## 2020-12-19 DIAGNOSIS — O365930 Maternal care for other known or suspected poor fetal growth, third trimester, not applicable or unspecified: Secondary | ICD-10-CM | POA: Insufficient documentation

## 2020-12-19 DIAGNOSIS — Z3A32 32 weeks gestation of pregnancy: Secondary | ICD-10-CM | POA: Insufficient documentation

## 2020-12-19 DIAGNOSIS — O365990 Maternal care for other known or suspected poor fetal growth, unspecified trimester, not applicable or unspecified: Secondary | ICD-10-CM | POA: Diagnosis present

## 2020-12-19 DIAGNOSIS — Z3A30 30 weeks gestation of pregnancy: Secondary | ICD-10-CM | POA: Diagnosis not present

## 2020-12-19 NOTE — Procedures (Signed)
Martha Kelley 1991/01/07 [redacted]w[redacted]d  Fetus A Non-Stress Test Interpretation for 12/19/20  Indication: IUGR  Fetal Heart Rate A Mode: External Baseline Rate (A): 140 bpm Variability: Moderate Accelerations: 15 x 15 Decelerations: None Multiple birth?: No  Uterine Activity Mode: Palpation,Toco Contraction Frequency (min): ui Contraction Duration (sec): 10-20 Contraction Quality: Mild Resting Tone Palpated: Relaxed Resting Time: Adequate  Interpretation (Fetal Testing) Nonstress Test Interpretation: Reactive Overall Impression: Reassuring for gestational age Comments: Dr. Parke Poisson reviewed tracing.

## 2020-12-28 ENCOUNTER — Other Ambulatory Visit: Payer: Self-pay | Admitting: Maternal & Fetal Medicine

## 2020-12-28 ENCOUNTER — Encounter: Payer: Self-pay | Admitting: *Deleted

## 2020-12-28 ENCOUNTER — Ambulatory Visit: Payer: Managed Care, Other (non HMO) | Attending: Maternal & Fetal Medicine

## 2020-12-28 ENCOUNTER — Ambulatory Visit: Payer: Managed Care, Other (non HMO) | Admitting: *Deleted

## 2020-12-28 ENCOUNTER — Other Ambulatory Visit: Payer: Self-pay

## 2020-12-28 ENCOUNTER — Other Ambulatory Visit: Payer: Self-pay | Admitting: *Deleted

## 2020-12-28 VITALS — BP 110/68 | HR 97

## 2020-12-28 DIAGNOSIS — O365990 Maternal care for other known or suspected poor fetal growth, unspecified trimester, not applicable or unspecified: Secondary | ICD-10-CM | POA: Insufficient documentation

## 2020-12-28 DIAGNOSIS — O365930 Maternal care for other known or suspected poor fetal growth, third trimester, not applicable or unspecified: Secondary | ICD-10-CM | POA: Diagnosis not present

## 2020-12-28 DIAGNOSIS — Z3A32 32 weeks gestation of pregnancy: Secondary | ICD-10-CM

## 2020-12-28 NOTE — Procedures (Signed)
Martha Kelley [birth year] [redacted]w[redacted]d  Fetus A Non-Stress Test Interpretation for 12/28/20  Indication: IUGR  Fetal Heart Rate A Mode: External Baseline Rate (A): 145 bpm Variability: Moderate Accelerations: 15 x 15 Decelerations: None Multiple birth?: No  Uterine Activity Mode: Palpation,Toco Contraction Frequency (min): none Resting Tone Palpated: Relaxed Resting Time: Adequate  Interpretation (Fetal Testing) Nonstress Test Interpretation: Reactive Overall Impression: Reassuring for gestational age Comments: Dr. Judeth Cornfield reviewed tracing.

## 2021-01-02 ENCOUNTER — Other Ambulatory Visit: Payer: Self-pay | Admitting: Maternal & Fetal Medicine

## 2021-01-02 ENCOUNTER — Ambulatory Visit: Payer: Managed Care, Other (non HMO) | Attending: Maternal & Fetal Medicine

## 2021-01-02 ENCOUNTER — Ambulatory Visit: Payer: Managed Care, Other (non HMO) | Admitting: *Deleted

## 2021-01-02 ENCOUNTER — Encounter: Payer: Self-pay | Admitting: *Deleted

## 2021-01-02 ENCOUNTER — Other Ambulatory Visit: Payer: Self-pay

## 2021-01-02 VITALS — BP 104/69 | HR 87

## 2021-01-02 DIAGNOSIS — O365930 Maternal care for other known or suspected poor fetal growth, third trimester, not applicable or unspecified: Secondary | ICD-10-CM

## 2021-01-02 DIAGNOSIS — O365990 Maternal care for other known or suspected poor fetal growth, unspecified trimester, not applicable or unspecified: Secondary | ICD-10-CM | POA: Insufficient documentation

## 2021-01-02 DIAGNOSIS — Z362 Encounter for other antenatal screening follow-up: Secondary | ICD-10-CM | POA: Diagnosis not present

## 2021-01-02 DIAGNOSIS — Z3A32 32 weeks gestation of pregnancy: Secondary | ICD-10-CM

## 2021-01-02 NOTE — Procedures (Signed)
Martha Kelley Jul 07, 1991 [redacted]w[redacted]d  Fetus A Non-Stress Test Interpretation for 01/02/21  Indication: IUGR  Fetal Heart Rate A Mode: External Baseline Rate (A): 140 bpm Variability: Moderate Accelerations: 15 x 15 Decelerations: None Multiple birth?: No  Uterine Activity Mode: Palpation,Toco Contraction Frequency (min): 1 uc Contraction Duration (sec): 80 Contraction Quality: Mild Resting Tone Palpated: Relaxed Resting Time: Adequate  Interpretation (Fetal Testing) Nonstress Test Interpretation: Reactive Overall Impression: Reassuring for gestational age Comments: Dr. Judeth Cornfield reviewed tracing

## 2021-01-12 ENCOUNTER — Encounter: Payer: Self-pay | Admitting: *Deleted

## 2021-01-12 ENCOUNTER — Other Ambulatory Visit: Payer: Self-pay

## 2021-01-12 ENCOUNTER — Ambulatory Visit: Payer: Managed Care, Other (non HMO)

## 2021-01-12 ENCOUNTER — Other Ambulatory Visit: Payer: Self-pay | Admitting: *Deleted

## 2021-01-12 ENCOUNTER — Ambulatory Visit: Payer: Managed Care, Other (non HMO) | Attending: Obstetrics and Gynecology

## 2021-01-12 ENCOUNTER — Ambulatory Visit: Payer: Managed Care, Other (non HMO) | Admitting: *Deleted

## 2021-01-12 VITALS — BP 111/70 | HR 90

## 2021-01-12 DIAGNOSIS — O365930 Maternal care for other known or suspected poor fetal growth, third trimester, not applicable or unspecified: Secondary | ICD-10-CM

## 2021-01-12 DIAGNOSIS — O365990 Maternal care for other known or suspected poor fetal growth, unspecified trimester, not applicable or unspecified: Secondary | ICD-10-CM | POA: Diagnosis present

## 2021-01-15 ENCOUNTER — Other Ambulatory Visit: Payer: Self-pay | Admitting: Obstetrics and Gynecology

## 2021-01-15 DIAGNOSIS — O365990 Maternal care for other known or suspected poor fetal growth, unspecified trimester, not applicable or unspecified: Secondary | ICD-10-CM

## 2021-01-16 DIAGNOSIS — Z3A34 34 weeks gestation of pregnancy: Secondary | ICD-10-CM

## 2021-01-16 DIAGNOSIS — O365930 Maternal care for other known or suspected poor fetal growth, third trimester, not applicable or unspecified: Secondary | ICD-10-CM | POA: Diagnosis not present

## 2021-01-16 DIAGNOSIS — Z362 Encounter for other antenatal screening follow-up: Secondary | ICD-10-CM

## 2021-01-18 ENCOUNTER — Encounter: Payer: Self-pay | Admitting: *Deleted

## 2021-01-18 ENCOUNTER — Ambulatory Visit: Payer: Managed Care, Other (non HMO) | Admitting: *Deleted

## 2021-01-18 ENCOUNTER — Ambulatory Visit: Payer: Managed Care, Other (non HMO) | Attending: Obstetrics and Gynecology

## 2021-01-18 ENCOUNTER — Other Ambulatory Visit: Payer: Self-pay

## 2021-01-18 VITALS — BP 104/73 | HR 96

## 2021-01-18 DIAGNOSIS — O365990 Maternal care for other known or suspected poor fetal growth, unspecified trimester, not applicable or unspecified: Secondary | ICD-10-CM | POA: Diagnosis not present

## 2021-01-18 DIAGNOSIS — Z3A35 35 weeks gestation of pregnancy: Secondary | ICD-10-CM

## 2021-01-18 DIAGNOSIS — O365930 Maternal care for other known or suspected poor fetal growth, third trimester, not applicable or unspecified: Secondary | ICD-10-CM

## 2021-01-18 LAB — OB RESULTS CONSOLE GBS: GBS: NEGATIVE

## 2021-01-18 NOTE — Procedures (Signed)
Alec Edge 09-29-1990 [redacted]w[redacted]d  Fetus A Non-Stress Test Interpretation for 01/18/21  Indication: IUGR  Fetal Heart Rate A Mode: External Baseline Rate (A): 140 bpm Variability: Moderate Accelerations: 15 x 15 Decelerations: None  Uterine Activity Mode: Palpation,Toco Contraction Frequency (min): UI Contraction Quality: Mild Resting Tone Palpated: Relaxed Resting Time: Adequate  Interpretation (Fetal Testing) Nonstress Test Interpretation: Reactive Comments: Dr. Parke Poisson reviewed tracing

## 2021-01-25 ENCOUNTER — Encounter: Payer: Self-pay | Admitting: *Deleted

## 2021-01-25 ENCOUNTER — Ambulatory Visit: Payer: Managed Care, Other (non HMO) | Admitting: *Deleted

## 2021-01-25 ENCOUNTER — Other Ambulatory Visit: Payer: Self-pay

## 2021-01-25 ENCOUNTER — Other Ambulatory Visit: Payer: Self-pay | Admitting: Obstetrics and Gynecology

## 2021-01-25 ENCOUNTER — Other Ambulatory Visit: Payer: Self-pay | Admitting: *Deleted

## 2021-01-25 ENCOUNTER — Ambulatory Visit: Payer: Managed Care, Other (non HMO)

## 2021-01-25 ENCOUNTER — Ambulatory Visit: Payer: Managed Care, Other (non HMO) | Attending: Obstetrics and Gynecology

## 2021-01-25 VITALS — BP 107/71 | HR 91

## 2021-01-25 DIAGNOSIS — O365990 Maternal care for other known or suspected poor fetal growth, unspecified trimester, not applicable or unspecified: Secondary | ICD-10-CM

## 2021-01-25 DIAGNOSIS — O365930 Maternal care for other known or suspected poor fetal growth, third trimester, not applicable or unspecified: Secondary | ICD-10-CM

## 2021-01-25 DIAGNOSIS — Z3A36 36 weeks gestation of pregnancy: Secondary | ICD-10-CM | POA: Diagnosis not present

## 2021-02-01 ENCOUNTER — Encounter: Payer: Self-pay | Admitting: *Deleted

## 2021-02-01 ENCOUNTER — Ambulatory Visit: Payer: Managed Care, Other (non HMO) | Admitting: *Deleted

## 2021-02-01 ENCOUNTER — Other Ambulatory Visit: Payer: Self-pay

## 2021-02-01 ENCOUNTER — Ambulatory Visit: Payer: Managed Care, Other (non HMO) | Attending: Obstetrics

## 2021-02-01 VITALS — BP 106/76 | HR 90

## 2021-02-01 DIAGNOSIS — O365930 Maternal care for other known or suspected poor fetal growth, third trimester, not applicable or unspecified: Secondary | ICD-10-CM | POA: Diagnosis present

## 2021-02-01 NOTE — Procedures (Signed)
Martha Kelley 04/03/1991 [redacted]w[redacted]d  Fetus A Non-Stress Test Interpretation for 02/01/21  Indication: IUGR  Fetal Heart Rate A Mode: External Baseline Rate (A): 150 bpm Variability: Moderate Accelerations: 15 x 15 Decelerations: None Multiple birth?: No  Uterine Activity Mode: Palpation,Toco Contraction Frequency (min): 2 ucs with ui Contraction Duration (sec): 70-80 Contraction Quality: Mild Resting Tone Palpated: Relaxed Resting Time: Adequate  Interpretation (Fetal Testing) Nonstress Test Interpretation: Reactive Overall Impression: Reassuring for gestational age Comments: Dr. Judeth Cornfield reviewed tracing

## 2021-02-08 ENCOUNTER — Encounter: Payer: Self-pay | Admitting: *Deleted

## 2021-02-08 ENCOUNTER — Other Ambulatory Visit: Payer: Self-pay | Admitting: Obstetrics & Gynecology

## 2021-02-08 ENCOUNTER — Ambulatory Visit: Payer: Managed Care, Other (non HMO) | Admitting: *Deleted

## 2021-02-08 ENCOUNTER — Encounter (HOSPITAL_COMMUNITY): Payer: Self-pay | Admitting: *Deleted

## 2021-02-08 ENCOUNTER — Ambulatory Visit: Payer: Managed Care, Other (non HMO) | Attending: Obstetrics and Gynecology

## 2021-02-08 ENCOUNTER — Telehealth (HOSPITAL_COMMUNITY): Payer: Self-pay | Admitting: *Deleted

## 2021-02-08 ENCOUNTER — Other Ambulatory Visit: Payer: Self-pay

## 2021-02-08 VITALS — BP 106/69 | HR 90

## 2021-02-08 DIAGNOSIS — O365930 Maternal care for other known or suspected poor fetal growth, third trimester, not applicable or unspecified: Secondary | ICD-10-CM

## 2021-02-08 DIAGNOSIS — O365990 Maternal care for other known or suspected poor fetal growth, unspecified trimester, not applicable or unspecified: Secondary | ICD-10-CM | POA: Diagnosis present

## 2021-02-08 DIAGNOSIS — Z3A38 38 weeks gestation of pregnancy: Secondary | ICD-10-CM

## 2021-02-08 NOTE — Telephone Encounter (Signed)
Preadmission screen  

## 2021-02-12 ENCOUNTER — Other Ambulatory Visit (HOSPITAL_COMMUNITY)
Admission: RE | Admit: 2021-02-12 | Discharge: 2021-02-12 | Disposition: A | Discharge status: Home / Self Care | Payer: Managed Care, Other (non HMO) | Source: Ambulatory Visit | Attending: Obstetrics & Gynecology | Admitting: Obstetrics & Gynecology

## 2021-02-12 DIAGNOSIS — Z20822 Contact with and (suspected) exposure to covid-19: Secondary | ICD-10-CM | POA: Insufficient documentation

## 2021-02-12 DIAGNOSIS — Z01812 Encounter for preprocedural laboratory examination: Secondary | ICD-10-CM | POA: Insufficient documentation

## 2021-02-12 LAB — SARS CORONAVIRUS 2 (TAT 6-24 HRS): SARS Coronavirus 2: NEGATIVE

## 2021-02-13 ENCOUNTER — Other Ambulatory Visit: Payer: Self-pay

## 2021-02-13 ENCOUNTER — Inpatient Hospital Stay (EMERGENCY_DEPARTMENT_HOSPITAL)
Admission: AD | Admit: 2021-02-13 | Discharge: 2021-02-13 | Disposition: A | Discharge status: Home / Self Care | Payer: Managed Care, Other (non HMO) | Source: Home / Self Care | Attending: Obstetrics and Gynecology | Admitting: Obstetrics and Gynecology

## 2021-02-13 ENCOUNTER — Encounter (HOSPITAL_COMMUNITY): Payer: Self-pay | Admitting: Obstetrics and Gynecology

## 2021-02-13 DIAGNOSIS — Z3A38 38 weeks gestation of pregnancy: Secondary | ICD-10-CM | POA: Insufficient documentation

## 2021-02-13 DIAGNOSIS — O471 False labor at or after 37 completed weeks of gestation: Secondary | ICD-10-CM | POA: Insufficient documentation

## 2021-02-13 DIAGNOSIS — Z3689 Encounter for other specified antenatal screening: Secondary | ICD-10-CM

## 2021-02-13 DIAGNOSIS — Z3A00 Weeks of gestation of pregnancy not specified: Secondary | ICD-10-CM | POA: Diagnosis not present

## 2021-02-13 HISTORY — DX: Sciatica, right side: M54.31

## 2021-02-13 HISTORY — DX: Personal history of other diseases of the female genital tract: Z87.42

## 2021-02-13 HISTORY — DX: Gestational diabetes mellitus in pregnancy, unspecified control: O24.419

## 2021-02-13 NOTE — MAU Provider Note (Signed)
Patient was assessed for active labor and managed by nursing staff during this encounter. I have reviewed the chart and agree with the documentation and plan. I have also reviewed the NST for appropriate reactivity.  Fetal Tracing: reactive Baseline: 145 Variability: moderate Accelerations: 15x15  Decelerations: none Toco: q2-37min   Patient stable for discharge home with labor precautions.  Edd Arbour, CNM, MSN, IBCLC Certified Nurse Midwife, [facility] 02/13/21 6:46 AM

## 2021-02-13 NOTE — MAU Note (Signed)
..  Martha Kelley is a 31 y.o. at [redacted]w[redacted]d here in MAU reporting: Contractions since 2:30 am that are now every 5 minutes. Denies vaginal bleeding or leaking of fluid. +FM Pain score: 5/10

## 2021-02-14 ENCOUNTER — Inpatient Hospital Stay (HOSPITAL_COMMUNITY): Payer: Managed Care, Other (non HMO) | Admitting: Anesthesiology

## 2021-02-14 ENCOUNTER — Inpatient Hospital Stay (HOSPITAL_COMMUNITY): Payer: Managed Care, Other (non HMO)

## 2021-02-14 ENCOUNTER — Inpatient Hospital Stay (HOSPITAL_COMMUNITY)
Admission: AD | Admit: 2021-02-14 | Discharge: 2021-02-16 | DRG: 807 | Disposition: A | Discharge status: Home / Self Care | Payer: Managed Care, Other (non HMO) | Attending: Obstetrics & Gynecology | Admitting: Obstetrics & Gynecology

## 2021-02-14 ENCOUNTER — Encounter (HOSPITAL_COMMUNITY): Payer: Self-pay | Admitting: Obstetrics & Gynecology

## 2021-02-14 DIAGNOSIS — Z3A38 38 weeks gestation of pregnancy: Secondary | ICD-10-CM

## 2021-02-14 DIAGNOSIS — E282 Polycystic ovarian syndrome: Secondary | ICD-10-CM | POA: Diagnosis present

## 2021-02-14 DIAGNOSIS — O365930 Maternal care for other known or suspected poor fetal growth, third trimester, not applicable or unspecified: Principal | ICD-10-CM | POA: Diagnosis present

## 2021-02-14 DIAGNOSIS — O99284 Endocrine, nutritional and metabolic diseases complicating childbirth: Secondary | ICD-10-CM | POA: Diagnosis present

## 2021-02-14 DIAGNOSIS — Z20822 Contact with and (suspected) exposure to covid-19: Secondary | ICD-10-CM | POA: Diagnosis present

## 2021-02-14 DIAGNOSIS — O24420 Gestational diabetes mellitus in childbirth, diet controlled: Secondary | ICD-10-CM | POA: Diagnosis present

## 2021-02-14 DIAGNOSIS — Z3490 Encounter for supervision of normal pregnancy, unspecified, unspecified trimester: Secondary | ICD-10-CM | POA: Diagnosis present

## 2021-02-14 DIAGNOSIS — O24410 Gestational diabetes mellitus in pregnancy, diet controlled: Secondary | ICD-10-CM | POA: Diagnosis present

## 2021-02-14 LAB — TYPE AND SCREEN
ABO/RH(D): B POS
Antibody Screen: NEGATIVE

## 2021-02-14 LAB — RPR: RPR Ser Ql: NONREACTIVE

## 2021-02-14 LAB — CBC
HCT: 31.2 % — ABNORMAL LOW (ref 36.0–46.0)
Hemoglobin: 11 g/dL — ABNORMAL LOW (ref 12.0–15.0)
MCH: 31.8 pg (ref 26.0–34.0)
MCHC: 35.3 g/dL (ref 30.0–36.0)
MCV: 90.2 fL (ref 80.0–100.0)
Platelets: 234 10*3/uL (ref 150–400)
RBC: 3.46 MIL/uL — ABNORMAL LOW (ref 3.87–5.11)
RDW: 12.8 % (ref 11.5–15.5)
WBC: 9.5 10*3/uL (ref 4.0–10.5)
nRBC: 0 % (ref 0.0–0.2)

## 2021-02-14 LAB — GLUCOSE, CAPILLARY
Glucose-Capillary: 77 mg/dL (ref 70–99)
Glucose-Capillary: 81 mg/dL (ref 70–99)
Glucose-Capillary: 91 mg/dL (ref 70–99)

## 2021-02-14 MED ORDER — DIPHENHYDRAMINE HCL 25 MG PO CAPS: 25.0000 mg | ORAL_CAPSULE | Freq: Four times a day (QID) | ORAL | Status: DC | PRN

## 2021-02-14 MED ORDER — FENTANYL-BUPIVACAINE-NACL 0.5-0.125-0.9 MG/250ML-% EP SOLN
EPIDURAL | Status: DC | PRN
  Administered 2021-02-14: 12 mL/h via EPIDURAL

## 2021-02-14 MED ORDER — ONDANSETRON HCL 4 MG/2ML IJ SOLN: 4.0000 mg | INTRAMUSCULAR | Status: DC | PRN

## 2021-02-14 MED ORDER — LACTATED RINGERS IV SOLN: 500.0000 mL | INTRAVENOUS | Status: DC | PRN

## 2021-02-14 MED ORDER — OXYTOCIN-SODIUM CHLORIDE 30-0.9 UT/500ML-% IV SOLN: 2.5000 [IU]/h | INTRAVENOUS | Status: DC

## 2021-02-14 MED ORDER — ACETAMINOPHEN 325 MG PO TABS: 650.0000 mg | ORAL_TABLET | ORAL | Status: DC | PRN

## 2021-02-14 MED ORDER — DIBUCAINE (PERIANAL) 1 % EX OINT: 1.0000 "application " | TOPICAL_OINTMENT | CUTANEOUS | Status: DC | PRN

## 2021-02-14 MED ORDER — EPHEDRINE 5 MG/ML INJ: 10.0000 mg | INTRAVENOUS | Status: DC | PRN

## 2021-02-14 MED ORDER — OXYTOCIN BOLUS FROM INFUSION
333.0000 mL | Freq: Once | INTRAVENOUS | Status: AC
  Administered 2021-02-14: 333 mL via INTRAVENOUS

## 2021-02-14 MED ORDER — PHENYLEPHRINE 40 MCG/ML (10ML) SYRINGE FOR IV PUSH (FOR BLOOD PRESSURE SUPPORT): 80.0000 ug | PREFILLED_SYRINGE | INTRAVENOUS | Status: DC | PRN

## 2021-02-14 MED ORDER — MISOPROSTOL 25 MCG QUARTER TABLET
25.0000 ug | ORAL_TABLET | ORAL | Status: DC | PRN
  Administered 2021-02-14: 25 ug via VAGINAL
  Filled 2021-02-14 (×2): qty 1

## 2021-02-14 MED ORDER — TETANUS-DIPHTH-ACELL PERTUSSIS 5-2.5-18.5 LF-MCG/0.5 IM SUSY: 0.5000 mL | PREFILLED_SYRINGE | Freq: Once | INTRAMUSCULAR | Status: DC

## 2021-02-14 MED ORDER — LIDOCAINE HCL (PF) 1 % IJ SOLN
INTRAMUSCULAR | Status: DC | PRN
  Administered 2021-02-14: 10 mL via EPIDURAL
  Administered 2021-02-14: 2 mL via EPIDURAL

## 2021-02-14 MED ORDER — COCONUT OIL OIL: 1.0000 "application " | TOPICAL_OIL | Status: DC | PRN

## 2021-02-14 MED ORDER — ACETAMINOPHEN 325 MG PO TABS
650.0000 mg | ORAL_TABLET | ORAL | Status: DC | PRN
  Administered 2021-02-15 (×2): 650 mg via ORAL
  Filled 2021-02-14 (×2): qty 2

## 2021-02-14 MED ORDER — IBUPROFEN 600 MG PO TABS
600.0000 mg | ORAL_TABLET | Freq: Four times a day (QID) | ORAL | Status: DC
  Administered 2021-02-14 – 2021-02-16 (×8): 600 mg via ORAL
  Filled 2021-02-14 (×8): qty 1

## 2021-02-14 MED ORDER — OXYTOCIN-SODIUM CHLORIDE 30-0.9 UT/500ML-% IV SOLN
1.0000 m[IU]/min | INTRAVENOUS | Status: DC
  Administered 2021-02-14: 2 m[IU]/min via INTRAVENOUS
  Filled 2021-02-14: qty 500

## 2021-02-14 MED ORDER — LACTATED RINGERS IV SOLN: 500.0000 mL | Freq: Once | INTRAVENOUS | Status: DC

## 2021-02-14 MED ORDER — PRENATAL MULTIVITAMIN CH
1.0000 | ORAL_TABLET | Freq: Every day | ORAL | Status: DC
  Administered 2021-02-15 – 2021-02-16 (×2): 1 via ORAL
  Filled 2021-02-14 (×2): qty 1

## 2021-02-14 MED ORDER — ONDANSETRON HCL 4 MG PO TABS: 4.0000 mg | ORAL_TABLET | ORAL | Status: DC | PRN

## 2021-02-14 MED ORDER — SOD CITRATE-CITRIC ACID 500-334 MG/5ML PO SOLN: 30.0000 mL | ORAL | Status: DC | PRN

## 2021-02-14 MED ORDER — FENTANYL-BUPIVACAINE-NACL 0.5-0.125-0.9 MG/250ML-% EP SOLN
12.0000 mL/h | EPIDURAL | Status: DC | PRN
  Filled 2021-02-14: qty 250

## 2021-02-14 MED ORDER — TERBUTALINE SULFATE 1 MG/ML IJ SOLN: 0.2500 mg | Freq: Once | INTRAMUSCULAR | Status: DC | PRN

## 2021-02-14 MED ORDER — SENNOSIDES-DOCUSATE SODIUM 8.6-50 MG PO TABS
2.0000 | ORAL_TABLET | Freq: Every day | ORAL | Status: DC
  Administered 2021-02-15 – 2021-02-16 (×2): 2 via ORAL
  Filled 2021-02-14 (×2): qty 2

## 2021-02-14 MED ORDER — ONDANSETRON HCL 4 MG/2ML IJ SOLN: 4.0000 mg | Freq: Four times a day (QID) | INTRAMUSCULAR | Status: DC | PRN

## 2021-02-14 MED ORDER — LIDOCAINE HCL (PF) 1 % IJ SOLN: 30.0000 mL | INTRAMUSCULAR | Status: DC | PRN

## 2021-02-14 MED ORDER — BENZOCAINE-MENTHOL 20-0.5 % EX AERO: 1.0000 "application " | INHALATION_SPRAY | CUTANEOUS | Status: DC | PRN

## 2021-02-14 MED ORDER — SIMETHICONE 80 MG PO CHEW: 80.0000 mg | CHEWABLE_TABLET | ORAL | Status: DC | PRN

## 2021-02-14 MED ORDER — DIPHENHYDRAMINE HCL 50 MG/ML IJ SOLN: 12.5000 mg | INTRAMUSCULAR | Status: DC | PRN

## 2021-02-14 MED ORDER — LACTATED RINGERS IV SOLN: INTRAVENOUS | Status: DC

## 2021-02-14 MED ORDER — WITCH HAZEL-GLYCERIN EX PADS: 1.0000 "application " | MEDICATED_PAD | CUTANEOUS | Status: DC | PRN

## 2021-02-14 NOTE — Progress Notes (Signed)
Patient educated regarding pain management options. Patient states she does not currently desire epidural at this time. Patient will notify RN when ready for epidural.   Wendelyn Breslow RN

## 2021-02-14 NOTE — Anesthesia Preprocedure Evaluation (Signed)
Anesthesia Evaluation  Patient identified by MRN, date of birth, ID band Patient awake    Reviewed: Allergy & Precautions, Patient's Chart, lab work & pertinent test results  Airway Mallampati: II  TM Distance: >3 FB Neck ROM: Full    Dental no notable dental hx.    Pulmonary neg pulmonary ROS,    Pulmonary exam normal breath sounds clear to auscultation       Cardiovascular negative cardio ROS Normal cardiovascular exam Rhythm:Regular Rate:Normal     Neuro/Psych negative neurological ROS  negative psych ROS   GI/Hepatic negative GI ROS, Neg liver ROS,   Endo/Other  diabetes (diet controlled), Gestational  Renal/GU negative Renal ROS  negative genitourinary   Musculoskeletal negative musculoskeletal ROS (+)   Abdominal   Peds negative pediatric ROS (+)  Hematology negative hematology ROS (+)   Anesthesia Other Findings   Reproductive/Obstetrics (+) Pregnancy                             Anesthesia Physical Anesthesia Plan  ASA: 2 and emergent  Anesthesia Plan: Epidural   Post-op Pain Management:    Induction:   PONV Risk Score and Plan: 2  Airway Management Planned: Natural Airway  Additional Equipment: None  Intra-op Plan:   Post-operative Plan:   Informed Consent: I have reviewed the patients History and Physical, chart, labs and discussed the procedure including the risks, benefits and alternatives for the proposed anesthesia with the patient or authorized representative who has indicated his/her understanding and acceptance.       Plan Discussed with:   Anesthesia Plan Comments:         Anesthesia Quick Evaluation

## 2021-02-14 NOTE — Progress Notes (Addendum)
Martha Kelley is a 30 y.o. G3P1011 at [redacted]w[redacted]d by ultrasound admitted for induction of labor due to Poor fetal growth.  Subjective: Ready for epidural  Objective: BP 105/74   Pulse 84   Temp 98 F (36.7 C) (Oral)   Resp 16   Ht 4\' 11"  (1.499 m)   Wt 65.1 kg   LMP 05/18/2020   BMI 28.99 kg/m  No intake/output data recorded. No intake/output data recorded.  FHT:  FHR: 135 bpm, variability: moderate,  accelerations:  Present,  decelerations:  Absent UC:   regular, every 2 minutes SVE:   Dilation: 4 Effacement (%): 70 Station: -2, -3 Exam by:: Nadean Montanaro AROM, clear fluid, stn high but well applied to cervix   Assessment / Plan: IOL for IUGR. Contracting well with 1st Cytotec dose. Plan pitocin per protocol 2x2 if UCs space out.   Labor: Progressing normally in early labor  Fetal Wellbeing:  Category I Pain Control:  Epidural requested  I/D:  n/a Anticipated MOD:  NSVD  [phone] 02/14/2021, 7:44 AM

## 2021-02-14 NOTE — Lactation Note (Signed)
This note was copied from a baby's chart. Lactation Consultation Note  Patient Name: Martha Kelley HFWYO'V Date: 02/14/2021 Reason for consult: L&D Initial assessment;Early term 37-38.6wks Age:31 hours  Mom is a P2 who said her 1st child did not latch. She pumped & BO for 1 year with her 1st child.  I put infant in position to latch & I attempted hand expression to entice infant to latch, but she was not interested in latching at this time. I did not note any droplets of colostrum when I attempted hand expression. Mom's nipples appear flat at this time, but may become more erect when infant is ready/attempts to feed.   As infant was not cueing, I placed infant skin-to-skin on Mom's chest & reassured her. Lactation to f/u later on the postpartum floor.    Lurline Hare [facility] 02/14/2021, 12:50 PM

## 2021-02-14 NOTE — Lactation Note (Addendum)
This note was copied from a baby's chart. Lactation Consultation Note  Patient Name: Martha Kelley TMLYY'T Date: 02/14/2021 Reason for consult: Follow-up assessment;Mother's request;Difficult latch;Early term 37-38.6wks (low blood sugars, lethargic -very sleepy) Age:31 hours, 3 voids and 2 stools. Per mom, infant is not latching at breast, been very sleepy mom mostly hand express and given infant back EBM with spoon. LC observed infant's behavior is more of LPTI. Mom did breast stimulation prior to latching infant at the breast to help evert nipple shaft out more, mom has semi -flat nipples, mom attempted to latch infant on her right breast using the football hold position, infant did not sustain latch became sleepy. Infant latched less than 3 minutes, mom hand expressed 3 mls that was bottle feed with purple and gold bottle nipple, mom declined donor breast milk and LEAD was taught infant took additional 10 mls of Similac Neosure 22 kcal formula from purple and gold bottle nipple. LC discussed infant's input and output with mom. Mom shown how to use DEBP & how to disassemble, clean, & reassemble parts.  Mom made aware of O/P services, breastfeeding support groups, community resources, and our phone # for post-discharge questions.   Mom's plan: 1- Mom will pre-pump breast with hand pump and continue to latch infant according to feeding cues, 8 -12 times within 24 hours, STS. 2- Mom will follow LPTI feeding plan -green sheet given. 3- Mom will pump every 3 hours for 15 minutes on initial setting, give infant back any EBM first  using slow flow bottle nipple ( purple & gold) before offering formula. 4- Mom will supplement infant with 5- 10 mls of EBM/ and or formula on Day 1 of life. 5-  Mom will continue to work on latching infant at the breast and will ask RN or LC to assist with latch if needed.  Maternal Data Has patient been taught Hand Expression?: Yes Does the patient have  breastfeeding experience prior to this delivery?: Yes How long did the patient breastfeed?: Per mom, her son never latched, mom pumped only for 1 year,  Feeding Mother's Current Feeding Choice: Breast Milk and Formula (Mom declined donor breast milk.)  LATCH Score Latch: Repeated attempts needed to sustain latch, nipple held in mouth throughout feeding, stimulation needed to elicit sucking reflex.  Audible Swallowing: A few with stimulation  Type of Nipple: Flat  Comfort (Breast/Nipple): Soft / non-tender  Hold (Positioning): Assistance needed to correctly position infant at breast and maintain latch.  LATCH Score: 6   Lactation Tools Discussed/Used Tools: Pump Breast pump type: Double-Electric Breast Pump Pump Education: Setup, frequency, and cleaning;Milk Storage Reason for Pumping: Infant very sleepy, low blood sugar is ETI infant less than 6 lbs. Pumping frequency: Mom knows to pump every 3 hours for 15 minutes on inital setting.  Interventions Interventions: Breast feeding basics reviewed;Assisted with latch;Skin to skin;Pre-pump if needed;Hand express;Breast compression;Adjust position;Support pillows;Position options;Expressed milk;Hand pump;DEBP;Education  Discharge Pump: DEBP;Manual;Personal (Mom has medela DEBP at home.) [facility] Program: No  Consult Status Consult Status: Follow-up Date: 02/15/21 Follow-up type: In-patient    [name] 02/14/2021, 11:39 PM

## 2021-02-14 NOTE — H&P (Signed)
Martha Kelley is a 30 y.o. female presenting for IUGR at 38.6 wks- advised IOL per MFM by 39 wks per Dr Parke Poisson  [id number], Hx of SAB then term VAVD for poor maternal effort, 6'3"   H/o PCOS, Metformin/ Femara 2nd cycle conception. [facility] 02/22/21 This pregnancy well dated by 1st trim sono. A1 GDM at 28 wks-- CBGs wnl. 27# TWG.  QUAD nl.  IUGR noted at 28 wks  Anatomy sono -AGA, 14%, AC 31%, but BPD/ HC small- 7% and 2%, AFI nl. So repeat growth done at 28 wks. At 28 wks- > EFW 2'2" 5% AC 12%, UAD elevated, so saw MFM and was followed for growth by MFM Ar 32 wks --> EFW at 3% and UAD at 90%  At 36 wks --> EFW improved at 8% and UAD was wnl. So she was advised IOL 38-39 wks instead of 37 wks by MFM  Antesting at office with BPP/ UAD and NST added from 32 wks- 10/10   OB History     Gravida  3   Para  1   Term  1   Preterm      AB  1   Living  1      SAB  1   IAB      Ectopic      Multiple  0   Live Births  1          Past Medical History:  Diagnosis Date   Back pain with right-sided sciatica    with pregnancy   Gestational diabetes    diet controlled A1DM   History of PCOS    Past Surgical History:  Procedure Laterality Date   NO PAST SURGERIES     Family History: family history includes Diabetes in her father; Hypertension in her mother. Social History:  reports that she has never smoked. She has never used smokeless tobacco. She reports that she does not drink alcohol and does not use drugs.     Maternal Diabetes: Yes:  Diabetes Type:  Diet controlled Genetic Screening: Normal  QUAD  Maternal Ultrasounds/Referrals: IUGR head parameters small at anatomy sono at 19 wks and IUGR noted from 28 wks, MFM followed growth in 3rd trim Fetal Ultrasounds or other Referrals:  Referred to Materal Fetal Medicine  Maternal Substance Abuse:  No Significant Maternal Medications:  None Significant Maternal Lab Results:  Group B Strep negative Other Comments:   None  Review of Systems History Dilation: 4.5 Effacement (%): 70 Station: -1 Exam by:: [name] Blood pressure 116/81, pulse 93, temperature 98.6 F (37 C), temperature source Oral, resp. rate 16, height 4\' 11"  (1.499 m), weight 65.1 kg, last menstrual period 05/18/2020, unknown if currently breastfeeding. Exam Physical Exam  Physical exam:  A&O x 3, no acute distress. Pleasant HEENT neg, no thyromegaly Lungs CTA bilat CV RRR, A1S2 normal Abdo soft, non tender, non acute gravid term  Extr no edema/ tenderness Pelvic Per RN, s/p one Cytotec and now cervix at 4-5 cm  FHT  130s + accels no decels mod variability- cat I Toco q 2-3 min  Prenatal labs: ABO, Rh: --/--/B POS (06/15 0025) Antibody: NEG (06/15 0025) Rubella: Immune, Immune (01/04 0000) RPR: Nonreactive (01/04 0000)  HBsAg: Negative, Negative (01/04 0000)  HIV: Non-reactive (01/04 0000)  GBS: Negative/-- (05/19 0000)  QUAD neg   Assessment/Plan: 30 yo G3P1011 at 38.6 wks here for IOL for IUGR Cytotec x 1 dose, now contracting well and can  proceed with epidural since active labor. GBS(-). FHT cat I. EFW 5.1/2 lbs- 6 lbs  Husband could not be present as 1st kid won't stay with any friends, no family here.    [name] 02/14/2021, 6:24 AM

## 2021-02-14 NOTE — Anesthesia Procedure Notes (Signed)
Epidural Patient location during procedure: OB Start time: 02/14/2021 7:57 AM End time: 02/14/2021 8:07 AM  Staffing Anesthesiologist: Lannie Fields, DO Performed: anesthesiologist   Preanesthetic Checklist Completed: patient identified, IV checked, risks and benefits discussed, monitors and equipment checked, pre-op evaluation and timeout performed  Epidural Patient position: sitting Prep: DuraPrep and site prepped and draped Patient monitoring: continuous pulse ox, blood pressure, heart rate and cardiac monitor Approach: midline Location: L3-L4 Injection technique: LOR air  Needle:  Needle type: Tuohy  Needle gauge: 17 G Needle length: 9 cm Needle insertion depth: 4 cm Catheter type: closed end flexible Catheter size: 19 Gauge Catheter at skin depth: 9 cm Test dose: negative  Assessment Sensory level: T8 Events: blood not aspirated, injection not painful, no injection resistance, no paresthesia and negative IV test  Additional Notes Patient identified. Risks/Benefits/Options discussed with patient including but not limited to bleeding, infection, nerve damage, paralysis, failed block, incomplete pain control, headache, blood pressure changes, nausea, vomiting, reactions to medication both or allergic, itching and postpartum back pain. Confirmed with bedside nurse the patient's most recent platelet count. Confirmed with patient that they are not currently taking any anticoagulation, have any bleeding history or any family history of bleeding disorders. Patient expressed understanding and wished to proceed. All questions were answered. Sterile technique was used throughout the entire procedure. Please see nursing notes for vital signs. Test dose was given through epidural catheter and negative prior to continuing to dose epidural or start infusion. Warning signs of high block given to the patient including shortness of breath, tingling/numbness in hands, complete motor block,  or any concerning symptoms with instructions to call for help. Patient was given instructions on fall risk and not to get out of bed. All questions and concerns addressed with instructions to call with any issues or inadequate analgesia.  Reason for block:procedure for pain

## 2021-02-15 LAB — CBC
HCT: 30.6 % — ABNORMAL LOW (ref 36.0–46.0)
Hemoglobin: 10.7 g/dL — ABNORMAL LOW (ref 12.0–15.0)
MCH: 31.8 pg (ref 26.0–34.0)
MCHC: 35 g/dL (ref 30.0–36.0)
MCV: 91.1 fL (ref 80.0–100.0)
Platelets: 190 10*3/uL (ref 150–400)
RBC: 3.36 MIL/uL — ABNORMAL LOW (ref 3.87–5.11)
RDW: 12.9 % (ref 11.5–15.5)
WBC: 10.5 10*3/uL (ref 4.0–10.5)
nRBC: 0 % (ref 0.0–0.2)

## 2021-02-15 NOTE — Progress Notes (Signed)
PPD # 1 S/P NSVD  Live born female  Birth Weight: 5 lb 11.7 oz (2600 g) APGAR: 9, 9  Newborn Delivery   Birth date/time: 02/14/2021 11:52:00 Delivery type: Vaginal, Spontaneous     Baby name: IWLNL Delivering provider: MODY, VAISHALI  Episiotomy:None   Lacerations:None   Feeding: breast and bottle  Pain control at delivery: Epidural   S:  Reports feeling well.             Tolerating po/ No nausea or vomiting             Bleeding is light             Pain controlled with acetaminophen and ibuprofen (OTC)             Up ad lib / ambulatory / voiding without difficulties   O:  A & O x 3, in no apparent distress              VS:  Vitals:   02/14/21 1455 02/14/21 1835 02/14/21 2234 02/15/21 0332  BP: 108/76 102/74 108/76 103/79  Pulse: 91 89 82 89  Resp: 18 18 16 16   Temp: 99 F (37.2 C) 98.2 F (36.8 C) 98 F (36.7 C) 98.2 F (36.8 C)  TempSrc: Oral Oral Oral Oral  SpO2:   100% 100%  Weight:      Height:        LABS:  Recent Labs    02/14/21 0005 02/15/21 0500  WBC 9.5 10.5  HGB 11.0* 10.7*  HCT 31.2* 30.6*  PLT 234 190    Blood type: --/--/B POS (06/15 0025)  Rubella: Immune, Immune (01/04 0000)  I&O: I/O last 3 completed shifts: In: -  Out: 1350 [Urine:1150; Blood:200]          No intake/output data recorded.  Vaccines: TDaP          UTD                    COVID-19 UTD  Gen: AAO x 3, NAD  Abdomen:  soft, non-tender, non-distended             Fundus: firm, non-tender, U-1  Perineum: intact, no edema  Lochia: small  Extremities: no edema, no calf pain or tenderness    A/P: PPD # 1 30 y.o., [id number]   Principal Problem:   Postpartum care following vaginal delivery - 6/15 Active Problems:   Encounter for induction of labor   SVD (spontaneous vaginal delivery)   GDM, class A1  - stable CBG's PP, f/u outpatient at [facility] visit  Doing well - stable status  LC support at home for difficult latch and baby with suspect tongue tie, resources for [employer] given  Routine post partum orders  Anticipate discharge tomorrow    Neta Mends, MSN, CNM 02/15/2021, 9:57 AM

## 2021-02-15 NOTE — Anesthesia Postprocedure Evaluation (Signed)
Anesthesia Post Note  Patient: Martha Kelley  Procedure(s) Performed: AN AD HOC LABOR EPIDURAL     Patient location during evaluation: Mother Baby Anesthesia Type: Epidural Level of consciousness: awake, awake and alert and oriented Pain management: pain level controlled Vital Signs Assessment: post-procedure vital signs reviewed and stable Respiratory status: spontaneous breathing and respiratory function stable Cardiovascular status: blood pressure returned to baseline Postop Assessment: no headache, epidural receding, patient able to bend at knees, adequate PO intake, no backache, no apparent nausea or vomiting and able to ambulate Anesthetic complications: no   No notable events documented.  Last Vitals:  Vitals:   02/14/21 2234 02/15/21 0332  BP: 108/76 103/79  Pulse: 82 89  Resp: 16 16  Temp: 36.7 C 36.8 C  SpO2: 100% 100%    Last Pain:  Vitals:   02/15/21 0429  TempSrc:   PainSc: 1    Pain Goal: Patients Stated Pain Goal: 3 (02/15/21 0332)                 Cleda Clarks

## 2021-02-15 NOTE — Lactation Note (Signed)
This note was copied from a baby's chart. Lactation Consultation Note  Patient Name: Martha Kelley [id number] Date: 02/15/2021   Age:31 hours, ETI female infant with -4% weight loss, infant had 2 voids and 2 stools today. LC entered room and MBU Banker) was assisting mom with hand expression, infant was given 3 mls of colostrum and 27 mls of Similac Neosure with iron 22 kcal with iron. Mom concern that with using the DEBP she is not seeing any colostrum ,LC reassured mom this is normal to continue pumping every 3 hours for 15 minutes on initial setting and afterwards to hand express and give infant back any of her EBM. Per mom, she latched infant twice today very briefly., LC praised mom for her efforts.    Maternal Data    Feeding Nipple Type: Extra Slow Flow  LATCH Score                    Lactation Tools Discussed/Used    Interventions    Discharge    Consult Status      [name] 02/15/2021, 9:43 PM

## 2021-02-15 NOTE — Discharge Instructions (Signed)
Lactation outpatient support - home visit  Linda Coppola RN, MHA, IBCLC at [facility]: Lactation Consultant  [email or web address] Mail: [email or web address] Tel: [phone]    Additional resources:  [facility] [email or web address]   Chiropractic specialist   Dr. Leanna Hastings [email or web address]  Craniosacral therapy for baby  Erin Balkind  [email or web address]  

## 2021-02-15 NOTE — Lactation Note (Signed)
This note was copied from a baby's chart. Lactation Consultation Note  Patient Name: Martha Kelley [id number] Date: 02/15/2021 Reason for consult: Follow-up assessment;Early term 37-38.6wks;1st time breastfeeding Age:31 hours   P2 mother whose infant is now 41 hours old.  This is an ETI at 38+6 weeks weighing < 6 lbs.  Mother reported that she was unable to breast feed her first child (now 33 years old).  He would not latch and mother pumped and bottle fed her EBM for one year.  Baby was swaddled and asleep when I arrived.  Mother concerned that her daughter is not latching well yet.  Reviewed breast feeding basics and reassured mother that she should wake up more today and tonight.  Suggested she continue to practice hand expression and finger feed/spoon feed any EBM she obtains to baby.  Mother is able to express colostrum.  Suggested mother call for latch assistance with the next feed.  Baby is supplementing with formula and should be taking between 10-20 mls every three hours now that she is > 24 hours.    Mother has a DEBP for home use.  No support person present at this time.     Maternal Data Has patient been taught Hand Expression?: Yes Does the patient have breastfeeding experience prior to this delivery?: No (Mother pumped and bottle fed for one year with her first child)  Feeding Mother's Current Feeding Choice: Breast Milk and Formula Nipple Type: Extra Slow Flow  LATCH Score                    Lactation Tools Discussed/Used Breast pump type: Double-Electric Breast Pump;Manual Reason for Pumping: Supplementation for early term infant Pumping frequency: Every three hours  Interventions    Discharge Pump: DEBP;Manual  Consult Status Consult Status: Follow-up Date: 02/16/21 Follow-up type: In-patient    Dora Sims 02/15/2021, 2:48 PM

## 2021-02-16 LAB — SURGICAL PATHOLOGY

## 2021-02-16 NOTE — Lactation Note (Signed)
This note was copied from a baby's chart. Lactation Consultation Note  Patient Name: [name] [id number] Date: 02/16/2021 Reason for consult: Follow-up assessment Age:31 hours   P2 mother whose infant is now 55 hours old.  This is an ETI at 38+6 weeks weighing < 6 lbs.  Mother's feeding preference is breast/formula.  Mother attempting to feed when I arrived.  Baby was latched but not sucking.  Offered to assist and mother agreeable.  Baby appears sleepy.  Awakened and assisted to latch to the left breast easily, however, she was not interested in initiating a suck.  Provided a few mls of formula and baby fed easily.  Repeated attempts to latch and suck at the breast were unsuccessful.  Tried to entice baby with formula at the breast and she took a few sucks and fell asleep.  Mother will supplement now and continue working on latching.  Baby does have an anterior lingual frenulum; mother aware.  Mother's nipples intact and rounded.  She denies pain when she is able to latch baby.  Mother is looking forward to being discharged today.  Her husband has been at home with her first child.  Mother's mother will visit in August from [place] and mother has a friend traveling in from [place] today to assist with caring for baby.  She has our OP phone number for any questions/concerns.  She also has a DEBP for home use.   Maternal Data    Feeding Nipple Type: Extra Slow Flow  LATCH Score Latch: Repeated attempts needed to sustain latch, nipple held in mouth throughout feeding, stimulation needed to elicit sucking reflex.  Audible Swallowing: None  Type of Nipple: Everted at rest and after stimulation  Comfort (Breast/Nipple): Soft / non-tender  Hold (Positioning): Assistance needed to correctly position infant at breast and maintain latch.  LATCH Score: 6   Lactation Tools Discussed/Used    Interventions Interventions: Breast feeding basics reviewed;Assisted with latch;Skin to  skin;Breast compression  Discharge Discharge Education: Engorgement and breast care  Consult Status Consult Status: Complete Date: 02/16/21 Follow-up type: Call as needed    Lizania Bouchard R Onnie Alatorre 02/16/2021, 7:43 AM

## 2021-02-16 NOTE — Discharge Summary (Signed)
OB Discharge Summary  Patient Name: Martha Kelley DOB: Aug 29, 1991 MRN: [id number]  Date of admission: 02/14/2021 Delivering provider: MODY, VAISHALI   Admitting diagnosis: Encounter for induction of labor [Z34.90] SVD (spontaneous vaginal delivery) [O80] Intrauterine pregnancy: [redacted]w[redacted]d     Secondary diagnosis: Patient Active Problem List   Diagnosis Date Noted   Encounter for induction of labor 02/14/2021   SVD (spontaneous vaginal delivery) 02/14/2021   GDM, class A1 02/14/2021   Status post vacuum-assisted vaginal delivery: maternal exhaustion 02/16/2018   Obstetrical laceration: median episiotomy  02/16/2018   Postpartum care following vaginal delivery - 6/15 02/15/2018   PROM (premature rupture of membranes) 02/14/2018    Date of discharge: 02/16/2021   Discharge diagnosis: Principal Problem:   Postpartum care following vaginal delivery - 6/15 Active Problems:   Encounter for induction of labor   SVD (spontaneous vaginal delivery)   GDM, class A1                                                           Post partum procedures: None  Augmentation: AROM, Pitocin, and Cytotec Pain control: Epidural  Laceration:None  Episiotomy:None  Complications: None  Hospital course:  Induction of Labor With Vaginal Delivery   30 y.o. yo [id number] at [redacted]w[redacted]d was admitted to the hospital 02/14/2021 for induction of labor.  Indication for induction:  IUGR .  Patient had an uncomplicated labor course as follows: Membrane Rupture Time/Date: 7:35 AM ,02/14/2021   Delivery Method:Vaginal, Spontaneous  Episiotomy: None  Lacerations:  None  Details of delivery can be found in separate delivery note.  Patient had a routine postpartum course. Patient is discharged home 02/16/21.  Newborn Data: Birth date:02/14/2021  Birth time:11:52 AM  Gender:Female  Living status:Living  Apgars:9 ,9  Weight:2600 g   Physical exam  Vitals:   02/14/21 2234 02/15/21 0332 02/15/21 1500 02/16/21 0526  BP:  108/76 103/79 104/82 103/76  Pulse: 82 89 79 72  Resp: 16 16 16 18   Temp: 98 F (36.7 C) 98.2 F (36.8 C) 98.1 F (36.7 C) 98.1 F (36.7 C)  TempSrc: Oral Oral Oral Oral  SpO2: 100% 100%  100%  Weight:      Height:       General: alert, cooperative, and no distress Lochia: appropriate Uterine Fundus: firm Incision: N/A Perineum: intact DVT Evaluation: No evidence of DVT seen on physical exam. Labs: Lab Results  Component Value Date   WBC 10.5 02/15/2021   HGB 10.7 (L) 02/15/2021   HCT 30.6 (L) 02/15/2021   MCV 91.1 02/15/2021   PLT 190 02/15/2021   No flowsheet data found. Edinburgh Postnatal Depression Scale Screening Tool 02/15/2021 02/15/2018  I have been able to laugh and see the funny side of things. 0 0  I have looked forward with enjoyment to things. 0 0  I have blamed myself unnecessarily when things went wrong. 0 0  I have been anxious or worried for no good reason. 0 2  I have felt scared or panicky for no good reason. 0 1  Things have been getting on top of me. 1 0  I have been so unhappy that I have had difficulty sleeping. 0 3  I have felt sad or miserable. 1 1  I have been so unhappy that I have been crying. 0 1  The thought of harming myself has occurred to me. 0 0  Edinburgh Postnatal Depression Scale Total 2 8   Vaccines: TDaP UTD         COVID-19   UTD  Discharge instructions:  per After Visit Summary  After Visit Meds:  Allergies as of 02/16/2021   No Known Allergies      Medication List     STOP taking these medications    calcium gluconate 500 MG tablet   ferrous sulfate 325 (65 FE) MG tablet       TAKE these medications    prenatal multivitamin Tabs tablet Take 1 tablet by mouth daily at 12 noon.        Diet: routine diet  Activity: Advance as tolerated. Pelvic rest for 6 weeks.   Newborn Data: Live born female  Birth Weight: 5 lb 11.7 oz (2600 g) APGAR: 9, 9  Newborn Delivery   Birth date/time: [birth year]/2021  11:52:00 Delivery type: Vaginal, Spontaneous     Named Sanvi Baby Feeding: Bottle and Breast Disposition:home with mother  Delivery Report:  Review the Delivery Report for details.    Follow up:  Follow-up Information     Shea Evans, MD. Schedule an appointment as soon as possible for a visit in 6 week(s).   Specialty: Obstetrics and Gynecology Contact information: [address] [place] [address] [phone]                 Clancy Gourd, MSN 02/16/2021, 11:05 AM

## 2021-04-04 ENCOUNTER — Encounter: Payer: Self-pay | Admitting: Neurology

## 2022-05-03 IMAGING — US US MFM FETAL BPP W/ NON-STRESS
1 series · 14 of 19 positions shown · non-contrast
Comparison: none

[Series 1: us mfm fetal bpp w/ non-stress · 19 acquisitions, 14 frames shown]
[im 1/19]
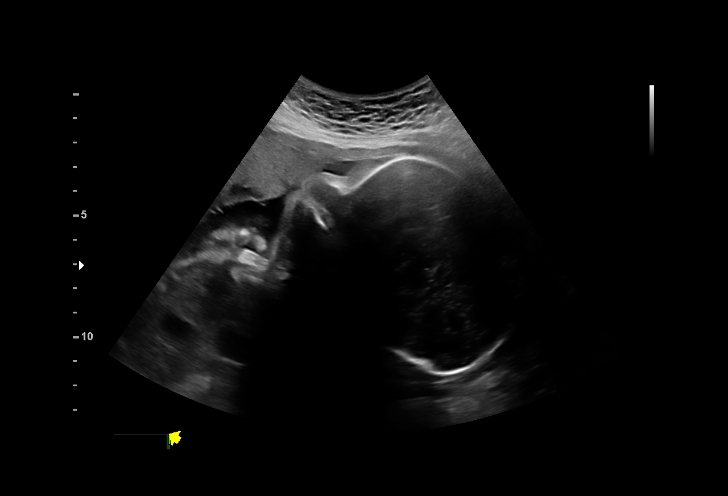
[im 3/19]
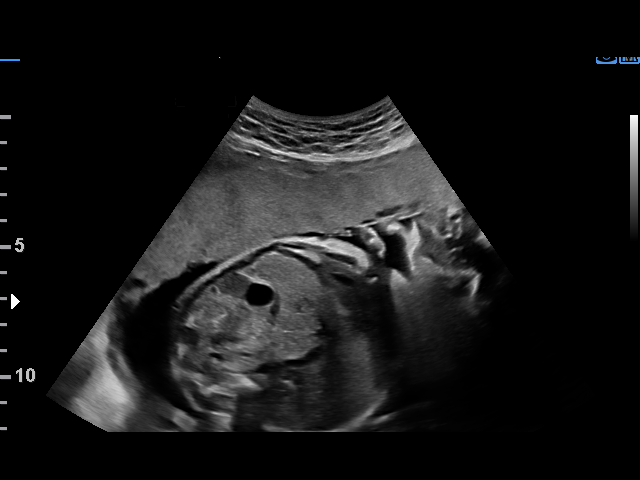
[im 4/19]
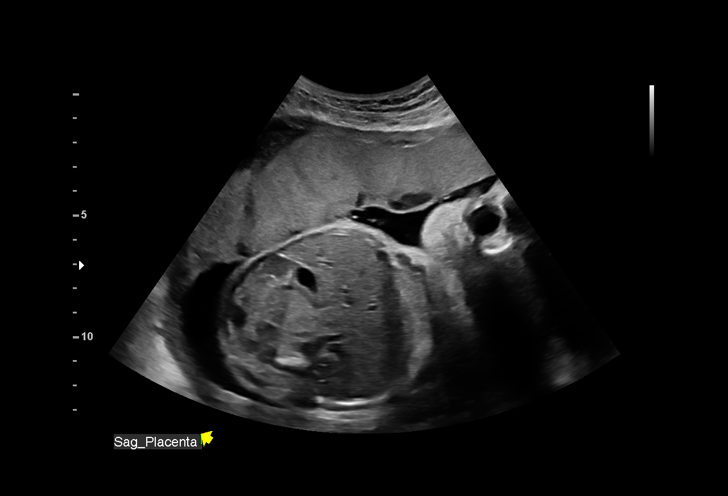
[im 5/19]
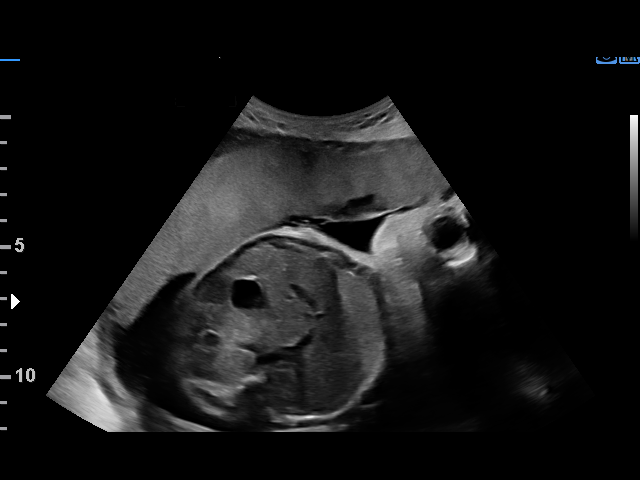
[im 7/19]
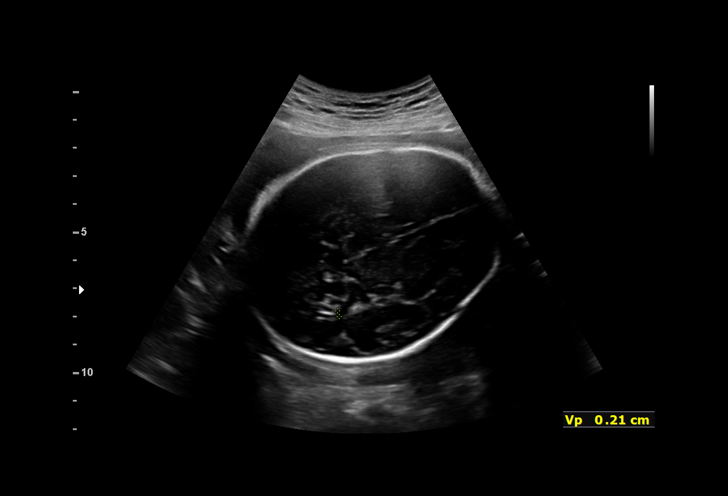
[im 8/19]
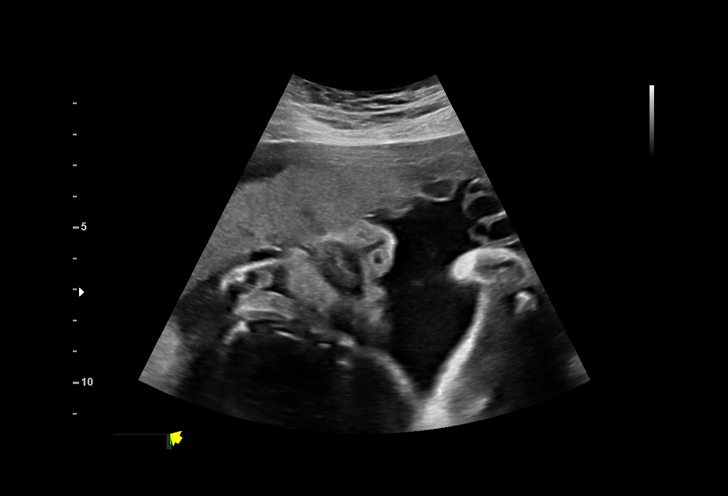
[im 9/19]
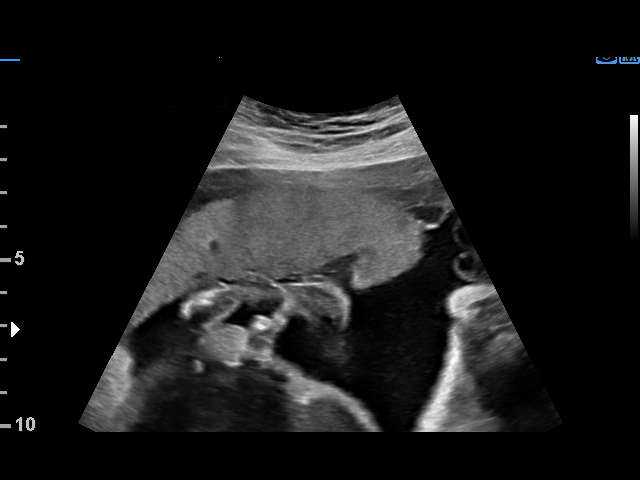
[im 11/19]
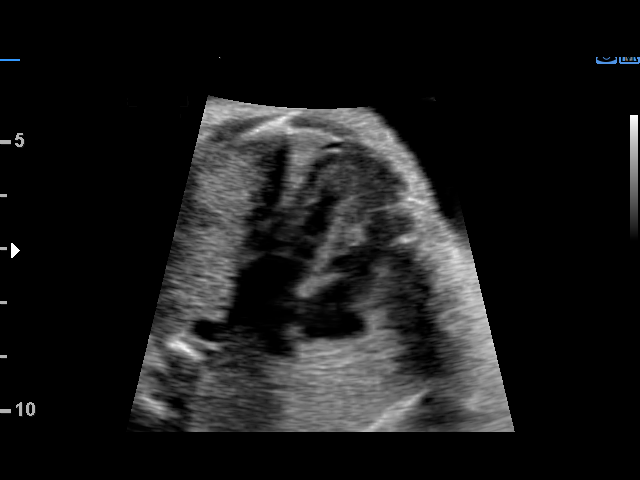
[im 12/19]
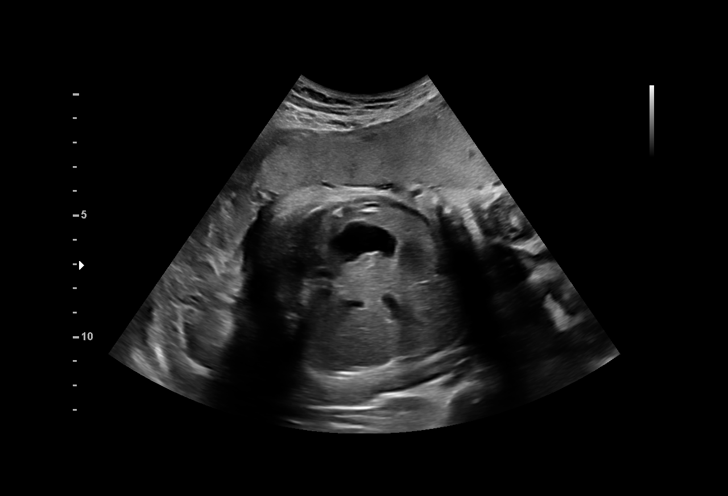
[im 13/19]
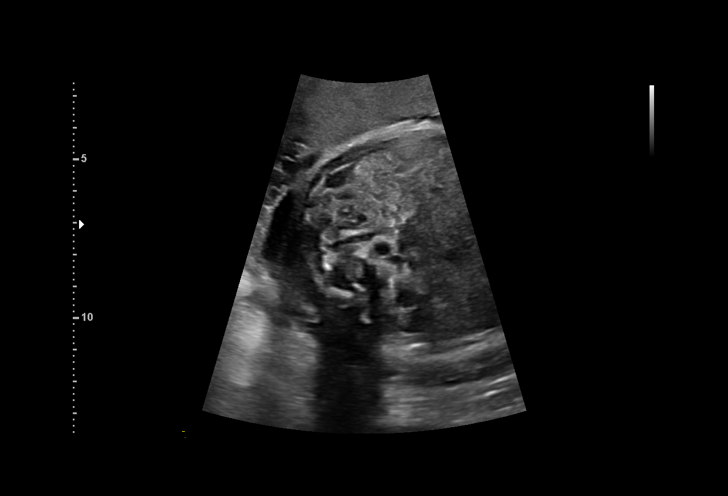
[im 15/19]
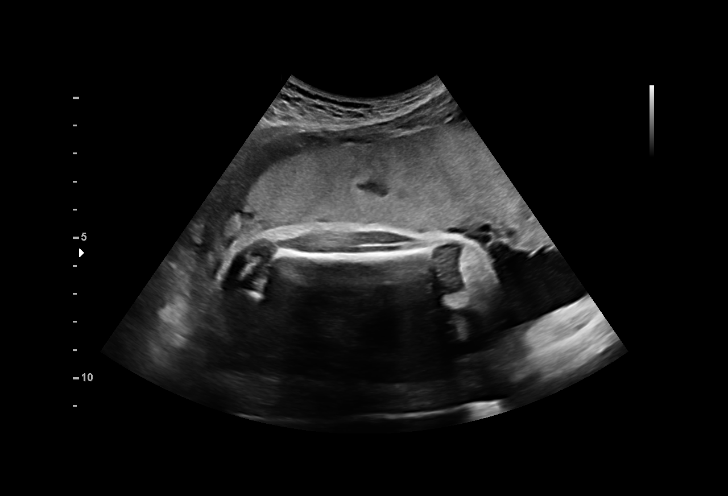
[im 16/19]
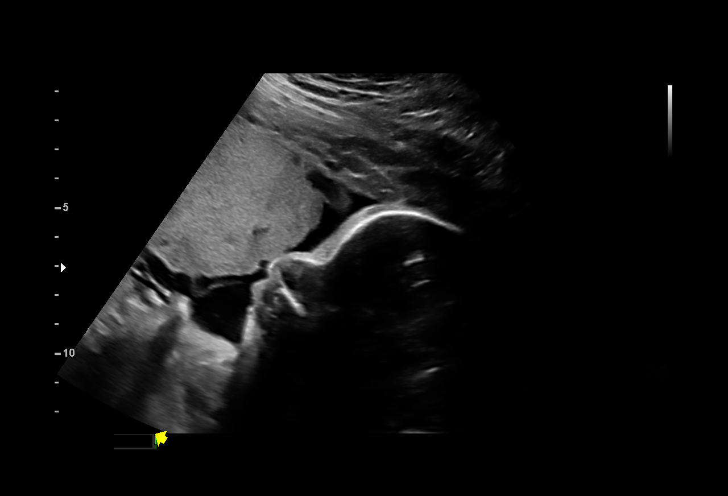
[im 17/19]
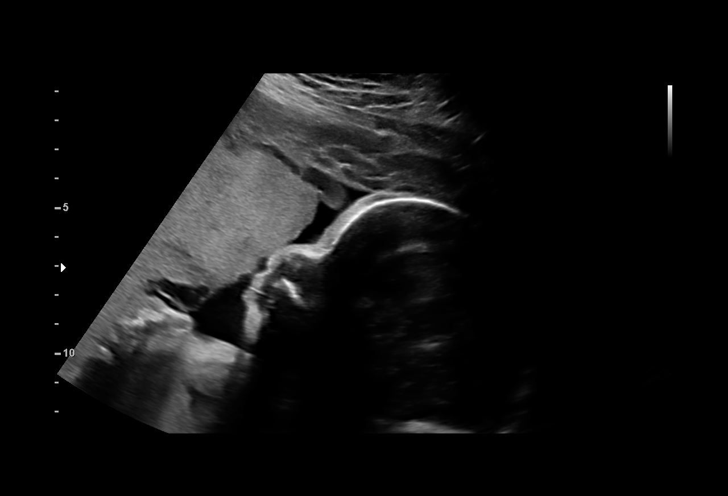
[im 19/19]
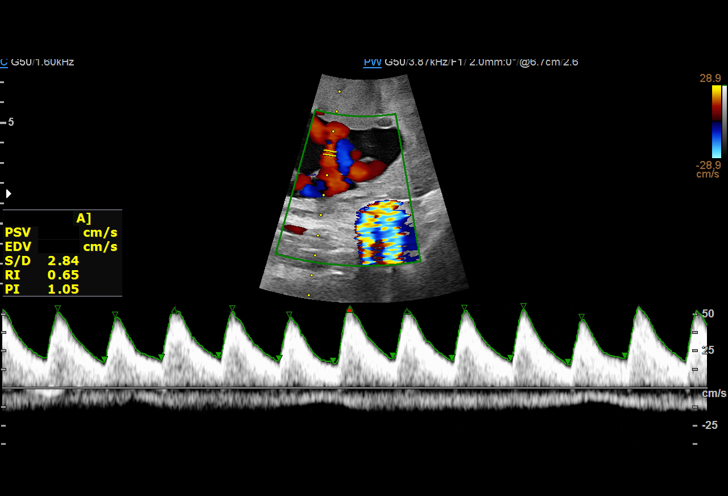

[14 of 19 positions shown; findings below may reference images not displayed]

1  US MFM UA CORD DOPPLER                76820.02    FISKE
                                                      NOUDIN
    SHEN/NONSTRESS                                       NOUDIN

Indications

 32 weeks gestation of pregnancy
 Maternal care for known or suspected poor
 fetal growth, third trimester, not applicable or
 unspecified IUGR
 Quad screen-negative
 Low risk NIPS
 Metformin for PCOS
 Encounter for other antenatal screening
 follow-up
Fetal Evaluation

 Num Of Fetuses:         1
 Fetal Heart Rate(bpm):  123
 Cardiac Activity:       Observed
 Presentation:           Cephalic
 Placenta:               Anterior

 Amniotic Fluid
 AFI FV:      Within normal limits

 AFI Sum(cm)     %Tile       Largest Pocket(cm)
 11.94           31

 RUQ(cm)       RLQ(cm)       LUQ(cm)        LLQ(cm)

Biophysical Evaluation

 Amniotic F.V:   Within normal limits       F. Tone:        Observed
 F. Movement:    Observed                   N.S.T:          Reactive
 F. Breathing:   Observed                   Score:          [DATE]
Biometry

 LV:        2.1  mm
OB History

 Gravidity:    3         Term:   1         SAB:   1
 Living:       1
Gestational Age

 LMP:           32w 0d        Date:  05/18/20                 EDD:   02/22/21
 Best:          32w 0d     Det. By:  LMP  (05/18/20)          EDD:   02/22/21
Anatomy

 Cranium:               Previously seen        LVOT:                   Previously seen
 Cavum:                 Previously seen        Aortic Arch:            Previously seen
 Ventricles:            Appears normal         Ductal Arch:            Previously seen
 Choroid Plexus:        Previously seen        Diaphragm:              Previously seen
 Cerebellum:            Previously seen        Stomach:                Appears normal, left
                                                                       sided
 Posterior Fossa:       Previously seen        Abdomen:                Previously seen
 Nuchal Fold:           Previously seen        Abdominal Wall:         Previously seen
 Face:                  Orbits appear          Cord Vessels:           Previously seen
                        normal
 Lips:                  Appears normal         Kidneys:                Appear normal
 Palate:                Previously seen        Bladder:                Appears normal
 Thoracic:              Previously seen        Spine:                  Previously seen
 Heart:                 Appears normal         Upper Extremities:      Previously seen
                        (4CH, axis, and
                        situs)
 RVOT:                  Previously seen        Lower Extremities:      Previously seen

 Other:  VC, 3VV and 3VTV previously visualized.
Doppler - Fetal Vessels

 Umbilical Artery
  S/D     %tile      RI    %tile      PI    %tile
  3.79       94    0.74       93    1.24       94

Cervix Uterus Adnexa

 Cervix
 Not visualized (advanced GA >00wks)

 Right Ovary
 Previously seen
 Left Ovary
 Previously seen.
Impression

 Patient with fetal growth restriction returned for antenatal
 testing.
 On ultrasound performed 3 weeks ago, the estimated fetal
 weight was at the 6 percentile.  Her blood pressures have
 been normal at prenatal visits.
 On today's ultrasound, amniotic fluid is normal good fetal
 activity seen.  Antenatal testing is reassuring.  Umbilical
 artery Doppler showed normal forward diastolic flow.  NST is
 reactive.  BPP [DATE].
 We reassured the patient of the findings.
Recommendations

 -Fetal growth assessment next week.
 -Continue weekly BPP, NST and UA Doppler studies till
 delivery.
                 Binge, Sadrag

## 2023-08-22 ENCOUNTER — Ambulatory Visit: Payer: Self-pay | Admitting: Podiatry

## 2024-01-11 ENCOUNTER — Ambulatory Visit
Admission: RE | Admit: 2024-01-11 | Discharge: 2024-01-11 | Disposition: A | Discharge status: Home / Self Care | Payer: Self-pay | Source: Ambulatory Visit | Attending: Family Medicine | Admitting: Family Medicine

## 2024-01-11 VITALS — BP 114/77 | HR 79 | Temp 98.5°F | Resp 16 | Ht 60.0 in | Wt 125.0 lb

## 2024-01-11 DIAGNOSIS — H6122 Impacted cerumen, left ear: Secondary | ICD-10-CM

## 2024-01-11 DIAGNOSIS — H65192 Other acute nonsuppurative otitis media, left ear: Secondary | ICD-10-CM

## 2024-01-11 MED ORDER — CETIRIZINE HCL 10 MG PO TABS: 10.0000 mg | ORAL_TABLET | Freq: Every day | ORAL | 0 refills | Status: AC

## 2024-01-11 MED ORDER — CEFDINIR 300 MG PO CAPS: 300.0000 mg | ORAL_CAPSULE | Freq: Two times a day (BID) | ORAL | 0 refills | Status: AC

## 2024-01-11 MED ORDER — PSEUDOEPHEDRINE HCL 30 MG PO TABS: 30.0000 mg | ORAL_TABLET | Freq: Three times a day (TID) | ORAL | 0 refills | Status: AC | PRN

## 2024-01-11 NOTE — ED Triage Notes (Signed)
 Patient c/o left ear fullness x 1 week.  Pain does radiate into head.  Patient has taken Oflocin ear drops w/o relief.  No cold sx's.  Denies any ear drainage.

## 2024-01-11 NOTE — ED Provider Notes (Signed)
 [facility]  Note:  This document was prepared using Conservation officer, historic buildings and may include unintentional dictation errors.  MRN: [id number] DOB: [birth year]  Subjective:   Martha Kelley is a 33 y.o. female presenting for 1 week history of left ear fullness, heaviness.  Patient reports that she feels intermittent pain radiate into her head on the left side.  No fever, spontaneous drainage, sinus symptoms.  She picked up and started using ofloxacin otic solution without a prescription from [place].  Has not helped her.  No current facility-administered medications for this encounter.  Current Outpatient Medications:    Prenatal Vit-Fe Fumarate-FA (PRENATAL MULTIVITAMIN) TABS tablet, Take 1 tablet by mouth daily at 12 noon., Disp: , Rfl:    Allergies  Allergen Reactions   Amoxil [Amoxicillin] Nausea And Vomiting    Past Medical History:  Diagnosis Date   Back pain with right-sided sciatica    with pregnancy   Gestational diabetes    diet controlled A1DM   History of PCOS      Past Surgical History:  Procedure Laterality Date   NO PAST SURGERIES      Family History  Problem Relation Age of Onset   Hypertension Mother    Diabetes Father     Social History   Tobacco Use   Smoking status: Never   Smokeless tobacco: Never  Vaping Use   Vaping status: Never Used  Substance Use Topics   Alcohol use: Never   Drug use: Never    ROS   Objective:   Vitals: BP 114/77 (BP Location: Right Arm)   Pulse 79   Temp 98.5 F (36.9 C) (Oral)   Resp 16   Ht 5' (1.524 m)   Wt 125 lb (56.7 kg)   LMP  (LMP Unknown)   SpO2 98%   Breastfeeding No   BMI 24.41 kg/m   Physical Exam Constitutional:      General: She is not in acute distress.    Appearance: Normal appearance. She is well-developed. She is not ill-appearing, toxic-appearing or diaphoretic.  HENT:     Head: Normocephalic and atraumatic.     Right Ear: Tympanic membrane,  ear canal and external ear normal. No tenderness. There is no impacted cerumen. Tympanic membrane is not injected, perforated, erythematous or bulging.     Left Ear: Ear canal and external ear normal. No tenderness. There is impacted cerumen. Tympanic membrane is not injected, perforated, erythematous or bulging.     Ears:     Comments: Erythematic and bulging left TM.    Nose: Nose normal.     Mouth/Throat:     Mouth: Mucous membranes are moist.  Eyes:     General: No scleral icterus.       Right eye: No discharge.        Left eye: No discharge.     Extraocular Movements: Extraocular movements intact.  Cardiovascular:     Rate and Rhythm: Normal rate.  Pulmonary:     Effort: Pulmonary effort is normal.  Skin:    General: Skin is warm and dry.  Neurological:     General: No focal deficit present.     Mental Status: She is alert and oriented to person, place, and time.  Psychiatric:        Mood and Affect: Mood normal.        Behavior: Behavior normal.     Ear lavage performed using mixture of peroxide and water.  Pressure irrigation performed  using a bottle and a thin ear tube.  Left ear lavage.  No curette was used.  The end of a Q-tip was irrigated out of her ear canal in its entirety.  Assessment and Plan :   PDMP not reviewed this encounter.  1. Impacted cerumen of left ear    Successful left ear lavage.  General management of cerumen impaction reviewed with patient.  Recommended covering for secondary otitis media with cefdinir.  Use Zyrtec and pseudoephedrine for supportive care, ibuprofen  for pain and inflammation.  Anticipatory guidance provided. Counseled patient on potential for adverse effects with medications prescribed/recommended today, ER and return-to-clinic precautions discussed, patient verbalized understanding.    Adolph Hoop, PA-C 01/11/24 1100
